# Patient Record
Sex: Female | Born: 1935 | Race: White | Hispanic: No
Health system: Southern US, Community
[De-identification: ages and names within clinical notes are randomized; demographics above are authoritative.]

## PROBLEM LIST (undated history)

## (undated) DIAGNOSIS — M810 Age-related osteoporosis without current pathological fracture: Secondary | ICD-10-CM

## (undated) DIAGNOSIS — M6281 Muscle weakness (generalized): Secondary | ICD-10-CM

## (undated) DIAGNOSIS — E785 Hyperlipidemia, unspecified: Secondary | ICD-10-CM

## (undated) DIAGNOSIS — F32A Depression, unspecified: Secondary | ICD-10-CM

## (undated) DIAGNOSIS — N390 Urinary tract infection, site not specified: Secondary | ICD-10-CM

## (undated) DIAGNOSIS — I2510 Atherosclerotic heart disease of native coronary artery without angina pectoris: Secondary | ICD-10-CM

## (undated) DIAGNOSIS — G9340 Encephalopathy, unspecified: Secondary | ICD-10-CM

## (undated) DIAGNOSIS — I10 Essential (primary) hypertension: Secondary | ICD-10-CM

## (undated) DIAGNOSIS — J45909 Unspecified asthma, uncomplicated: Secondary | ICD-10-CM

## (undated) DIAGNOSIS — E871 Hypo-osmolality and hyponatremia: Secondary | ICD-10-CM

## (undated) DIAGNOSIS — F0390 Unspecified dementia without behavioral disturbance: Secondary | ICD-10-CM

## (undated) DIAGNOSIS — F329 Major depressive disorder, single episode, unspecified: Secondary | ICD-10-CM

---

## 2003-03-11 ENCOUNTER — Encounter: Admission: RE | Admit: 2003-03-11 | Discharge: 2003-03-11 | Payer: Self-pay | Admitting: Neurosurgery

## 2003-04-26 ENCOUNTER — Encounter: Admission: RE | Admit: 2003-04-26 | Discharge: 2003-04-26 | Payer: Self-pay | Admitting: Neurosurgery

## 2003-09-01 ENCOUNTER — Encounter: Admission: RE | Admit: 2003-09-01 | Discharge: 2003-09-01 | Payer: Self-pay | Admitting: Neurosurgery

## 2003-09-02 ENCOUNTER — Ambulatory Visit (HOSPITAL_COMMUNITY): Admission: RE | Admit: 2003-09-02 | Discharge: 2003-09-02 | Payer: Self-pay | Admitting: Neurosurgery

## 2003-09-02 ENCOUNTER — Ambulatory Visit (HOSPITAL_BASED_OUTPATIENT_CLINIC_OR_DEPARTMENT_OTHER): Admission: RE | Admit: 2003-09-02 | Discharge: 2003-09-02 | Payer: Self-pay | Admitting: Neurosurgery

## 2003-11-11 ENCOUNTER — Ambulatory Visit: Payer: Self-pay | Admitting: Unknown Physician Specialty

## 2003-12-14 ENCOUNTER — Ambulatory Visit (HOSPITAL_COMMUNITY): Admission: RE | Admit: 2003-12-14 | Discharge: 2003-12-14 | Payer: Self-pay | Admitting: Anesthesiology

## 2004-09-22 ENCOUNTER — Ambulatory Visit: Payer: Self-pay | Admitting: Gerontology

## 2005-02-14 ENCOUNTER — Ambulatory Visit: Payer: Self-pay | Admitting: Anesthesiology

## 2005-02-27 ENCOUNTER — Ambulatory Visit: Payer: Self-pay | Admitting: Anesthesiology

## 2005-04-04 ENCOUNTER — Ambulatory Visit: Payer: Self-pay | Admitting: Anesthesiology

## 2005-04-20 ENCOUNTER — Ambulatory Visit: Payer: Self-pay | Admitting: Anesthesiology

## 2005-05-25 ENCOUNTER — Ambulatory Visit: Payer: Self-pay | Admitting: Internal Medicine

## 2005-05-31 ENCOUNTER — Ambulatory Visit: Payer: Self-pay | Admitting: Unknown Physician Specialty

## 2005-06-06 ENCOUNTER — Ambulatory Visit: Payer: Self-pay | Admitting: Unknown Physician Specialty

## 2005-06-28 ENCOUNTER — Ambulatory Visit: Payer: Self-pay | Admitting: Internal Medicine

## 2005-07-06 ENCOUNTER — Ambulatory Visit: Payer: Self-pay | Admitting: Internal Medicine

## 2005-11-09 ENCOUNTER — Ambulatory Visit: Payer: Self-pay | Admitting: Internal Medicine

## 2005-11-21 ENCOUNTER — Ambulatory Visit: Payer: Self-pay | Admitting: Internal Medicine

## 2005-11-21 ENCOUNTER — Encounter: Payer: Self-pay | Admitting: Internal Medicine

## 2006-01-05 ENCOUNTER — Encounter: Payer: Self-pay | Admitting: Internal Medicine

## 2006-01-21 ENCOUNTER — Ambulatory Visit: Payer: Self-pay | Admitting: Anesthesiology

## 2006-02-07 ENCOUNTER — Encounter: Payer: Self-pay | Admitting: Anesthesiology

## 2006-02-20 ENCOUNTER — Ambulatory Visit: Payer: Self-pay | Admitting: Anesthesiology

## 2006-02-26 ENCOUNTER — Ambulatory Visit: Payer: Self-pay | Admitting: Ophthalmology

## 2006-03-05 ENCOUNTER — Ambulatory Visit: Payer: Self-pay | Admitting: Ophthalmology

## 2006-03-08 ENCOUNTER — Encounter: Payer: Self-pay | Admitting: Anesthesiology

## 2006-03-19 ENCOUNTER — Ambulatory Visit: Payer: Self-pay | Admitting: Anesthesiology

## 2006-04-18 ENCOUNTER — Ambulatory Visit: Payer: Self-pay | Admitting: Anesthesiology

## 2006-04-24 ENCOUNTER — Ambulatory Visit: Payer: Self-pay | Admitting: Ophthalmology

## 2006-04-30 ENCOUNTER — Ambulatory Visit: Payer: Self-pay | Admitting: Ophthalmology

## 2006-05-20 ENCOUNTER — Ambulatory Visit: Payer: Self-pay | Admitting: Anesthesiology

## 2006-06-17 ENCOUNTER — Ambulatory Visit: Payer: Self-pay | Admitting: Anesthesiology

## 2006-08-12 ENCOUNTER — Ambulatory Visit: Payer: Self-pay | Admitting: Anesthesiology

## 2006-09-25 ENCOUNTER — Ambulatory Visit: Payer: Self-pay | Admitting: Anesthesiology

## 2007-03-12 ENCOUNTER — Ambulatory Visit: Payer: Self-pay | Admitting: Anesthesiology

## 2007-09-01 ENCOUNTER — Ambulatory Visit: Payer: Self-pay | Admitting: Urology

## 2007-10-13 ENCOUNTER — Emergency Department: Payer: Self-pay | Admitting: Internal Medicine

## 2007-10-14 ENCOUNTER — Emergency Department: Payer: Self-pay | Admitting: Emergency Medicine

## 2007-10-24 ENCOUNTER — Emergency Department: Payer: Self-pay | Admitting: Emergency Medicine

## 2008-08-16 ENCOUNTER — Ambulatory Visit: Payer: Self-pay | Admitting: Internal Medicine

## 2008-09-06 ENCOUNTER — Ambulatory Visit: Payer: Self-pay | Admitting: Anesthesiology

## 2008-10-07 ENCOUNTER — Ambulatory Visit: Payer: Self-pay | Admitting: Anesthesiology

## 2008-11-03 ENCOUNTER — Emergency Department: Payer: Self-pay | Admitting: Emergency Medicine

## 2008-12-01 ENCOUNTER — Ambulatory Visit: Payer: Self-pay | Admitting: Internal Medicine

## 2008-12-11 ENCOUNTER — Inpatient Hospital Stay: Payer: Self-pay | Admitting: Internal Medicine

## 2008-12-26 ENCOUNTER — Inpatient Hospital Stay: Payer: Self-pay | Admitting: Internal Medicine

## 2009-03-16 ENCOUNTER — Ambulatory Visit: Payer: Self-pay | Admitting: Anesthesiology

## 2009-05-18 ENCOUNTER — Ambulatory Visit: Payer: Self-pay | Admitting: Anesthesiology

## 2009-05-24 ENCOUNTER — Ambulatory Visit: Payer: Self-pay | Admitting: Internal Medicine

## 2009-07-09 ENCOUNTER — Ambulatory Visit: Payer: Self-pay

## 2009-08-15 ENCOUNTER — Inpatient Hospital Stay (HOSPITAL_COMMUNITY): Admission: RE | Admit: 2009-08-15 | Discharge: 2009-08-19 | Payer: Self-pay | Admitting: Neurosurgery

## 2009-11-25 ENCOUNTER — Observation Stay: Payer: Self-pay | Admitting: Internal Medicine

## 2010-01-30 ENCOUNTER — Emergency Department: Payer: Self-pay | Admitting: Emergency Medicine

## 2010-02-01 ENCOUNTER — Inpatient Hospital Stay: Payer: Self-pay | Admitting: Specialist

## 2010-04-23 LAB — DIFFERENTIAL
Basophils Absolute: 0 10*3/uL (ref 0.0–0.1)
Basophils Relative: 0 % (ref 0–1)
Eosinophils Absolute: 0.1 10*3/uL (ref 0.0–0.7)
Eosinophils Relative: 1 % (ref 0–5)
Lymphocytes Relative: 21 % (ref 12–46)
Lymphs Abs: 1.7 10*3/uL (ref 0.7–4.0)
Monocytes Absolute: 0.6 10*3/uL (ref 0.1–1.0)
Monocytes Relative: 8 % (ref 3–12)
Neutro Abs: 5.4 10*3/uL (ref 1.7–7.7)
Neutrophils Relative %: 69 % (ref 43–77)

## 2010-04-23 LAB — CBC
HCT: 39.3 % (ref 36.0–46.0)
Hemoglobin: 13.6 g/dL (ref 12.0–15.0)
MCH: 31.4 pg (ref 26.0–34.0)
MCHC: 34.5 g/dL (ref 30.0–36.0)
MCV: 91 fL (ref 78.0–100.0)
Platelets: 256 10*3/uL (ref 150–400)
RBC: 4.32 MIL/uL (ref 3.87–5.11)
RDW: 13.6 % (ref 11.5–15.5)
WBC: 7.8 10*3/uL (ref 4.0–10.5)

## 2010-04-23 LAB — BASIC METABOLIC PANEL
BUN: 16 mg/dL (ref 6–23)
CO2: 30 mEq/L (ref 19–32)
Calcium: 9.9 mg/dL (ref 8.4–10.5)
Chloride: 97 mEq/L (ref 96–112)
Creatinine, Ser: 0.96 mg/dL (ref 0.4–1.2)
GFR calc Af Amer: >60 mL/min (ref >60)
GFR calc non Af Amer: 55 mL/min — ABNORMAL LOW (ref >60)
Glucose, Bld: 114 mg/dL — ABNORMAL HIGH (ref 70–99)
Potassium: 4.1 mEq/L (ref 3.5–5.1)
Sodium: 134 mEq/L — ABNORMAL LOW (ref 135–145)

## 2010-04-23 LAB — TYPE AND SCREEN
ABO/RH(D): O NEG
Antibody Screen: NEGATIVE

## 2010-04-23 LAB — SURGICAL PCR SCREEN
MRSA, PCR: NEGATIVE
Staphylococcus aureus: NEGATIVE

## 2010-04-23 LAB — ABO/RH: ABO/RH(D): O NEG

## 2010-06-23 NOTE — Op Note (Signed)
NAME:  [name], [name]                           ACCOUNT NO.:  [id number]   MEDICAL RECORD NO.:  [id number]                   PATIENT TYPE:  AMB   LOCATION:  DSC                                  FACILITY:  [facility]   PHYSICIAN:  Henry A. Pool, M.D.                 DATE OF BIRTH:  January 29, 1923   DATE OF PROCEDURE:  09/02/2003  DATE OF DISCHARGE:                                 OPERATIVE REPORT   PREOPERATIVE DIAGNOSIS:  Right carpal tunnel syndrome.   POSTOPERATIVE DIAGNOSIS:  Right carpal tunnel syndrome.   PROCEDURE:  Right carpal tunnel release.   SURGEON:  Kathaleen Maser. Pool, M.D.   ANESTHESIA:  Regional Bier block.   INDICATIONS:  [name] is a  77 year old female with history of right hand  and forearm discomfort with associated paresthesias and numbness consistent  with right-sided carpal tunnel syndrome.  This has been confirmed by EMGs  and nerve conduction studies.  The patient has failed conservative  management.  She reports now for carpal tunnel release.   OPERATIVE NOTE:  Patient taken to the operating, placed on the table in a  supine position.  After an adequate level of regional anesthesia achieved,  the patient's right hand and forearm were prepped and draped serially.  A 15  blade was used to make a linear skin incision along the mid palmar line just  distal to the distal wrist crease.  This was carried down sharply to the  palmar fascia.  A self-retaining retractor was placed.  The transverse  carpal ligament was then identified and divided using a 15 blade down to the  level of the median nerve.  The median nerve was identified.  A Glorious Peach was  then placed between the median nerve and the transverse carpal ligament.  The transverse carpal ligament was then resected distally first until fat  was visualized.  There was no evidence of any residual compression.  The  ligament was then resected proximally into the proximal forearm.  There was  no evidence of any further  compression of the nerve.  The Glorious Peach passed  easily both proximally and distally.  The nerve showed no evidence of  injury.  The wound was then irrigated with antibiotic solution.  It was then  closed in typical fashion with 4-0 Vicryl suture in an inverted deep dermis  layer and 5-0 nylon in an interrupted vertical mattress fashion.  A sterile  dressing was applied.  The patient tolerated the procedure well and  transferred to the recovery room postoperatively.                                               Henry A. Pool, M.D.    HAP/MEDQ  D:  09/02/2003  T:  09/02/2003  Job:  [id number] [id number]

## 2010-08-22 ENCOUNTER — Ambulatory Visit: Payer: Self-pay | Admitting: Internal Medicine

## 2011-02-10 ENCOUNTER — Inpatient Hospital Stay: Payer: Self-pay | Admitting: Specialist

## 2011-02-10 LAB — COMPREHENSIVE METABOLIC PANEL
Alkaline Phosphatase: 80 U/L (ref 50–136)
BUN: 22 mg/dL — ABNORMAL HIGH (ref 7–18)
Chloride: 98 mmol/L (ref 98–107)
Co2: 23 mmol/L (ref 21–32)
Creatinine: 1 mg/dL (ref 0.60–1.30)
EGFR (African American): >60
EGFR (Non-African Amer.): 56 — ABNORMAL LOW
Glucose: 140 mg/dL — ABNORMAL HIGH (ref 65–99)
SGOT(AST): 19 U/L (ref 15–37)
SGPT (ALT): 19 U/L
Total Protein: 7.6 g/dL (ref 6.4–8.2)

## 2011-02-10 LAB — URINALYSIS, COMPLETE
Bilirubin,UR: NEGATIVE
Ketone: NEGATIVE
Nitrite: NEGATIVE
Ph: 5 (ref 4.5–8.0)
Squamous Epithelial: 2
WBC UR: 42 /HPF (ref 0–5)

## 2011-02-10 LAB — CBC
MCH: 30.8 pg (ref 26.0–34.0)
MCV: 92 fL (ref 80–100)
Platelet: 222 10*3/uL (ref 150–440)
RBC: 4 10*6/uL (ref 3.80–5.20)

## 2011-02-10 LAB — PROTIME-INR: INR: 1.1

## 2011-02-10 LAB — APTT: Activated PTT: 36.5 secs — ABNORMAL HIGH (ref 23.6–35.9)

## 2011-02-10 LAB — CK TOTAL AND CKMB (NOT AT ARMC): CK, Total: 94 U/L (ref 21–215)

## 2011-02-10 LAB — PRO B NATRIURETIC PEPTIDE: B-Type Natriuretic Peptide: 981 pg/mL — ABNORMAL HIGH (ref 0–450)

## 2011-02-11 LAB — CBC WITH DIFFERENTIAL/PLATELET
Basophil %: 0 %
Eosinophil #: 0 10*3/uL (ref 0.0–0.7)
Lymphocyte #: 0.5 10*3/uL — ABNORMAL LOW (ref 1.0–3.6)
MCH: 31.4 pg (ref 26.0–34.0)
MCHC: 34.4 g/dL (ref 32.0–36.0)
MCV: 91 fL (ref 80–100)
Monocyte #: 0.4 10*3/uL (ref 0.0–0.7)
Neutrophil #: 16.7 10*3/uL — ABNORMAL HIGH (ref 1.4–6.5)
Neutrophil %: 94.9 %

## 2011-02-11 LAB — LIPID PANEL
Cholesterol: 93 mg/dL (ref 0–200)
HDL Cholesterol: 45 mg/dL (ref 40–60)
Triglycerides: 68 mg/dL (ref 0–200)
VLDL Cholesterol, Calc: 14 mg/dL (ref 5–40)

## 2011-02-11 LAB — HEMOGLOBIN A1C: Hemoglobin A1C: 6.2 % (ref 4.2–6.3)

## 2011-02-11 LAB — BASIC METABOLIC PANEL
Anion Gap: 14 (ref 7–16)
BUN: 24 mg/dL — ABNORMAL HIGH (ref 7–18)
Calcium, Total: 8.7 mg/dL (ref 8.5–10.1)
Co2: 22 mmol/L (ref 21–32)
EGFR (African American): >60
EGFR (Non-African Amer.): 53 — ABNORMAL LOW
Glucose: 117 mg/dL — ABNORMAL HIGH (ref 65–99)
Osmolality: 277 (ref 275–301)
Potassium: 4 mmol/L (ref 3.5–5.1)
Sodium: 136 mmol/L (ref 136–145)

## 2011-02-11 LAB — CK TOTAL AND CKMB (NOT AT ARMC)
CK, Total: 98 U/L (ref 21–215)
CK-MB: 1.4 ng/mL (ref 0.5–3.6)

## 2011-02-11 LAB — TROPONIN I: Troponin-I: <0.02 ng/mL

## 2011-02-12 LAB — CBC WITH DIFFERENTIAL/PLATELET
Basophil %: 0.2 %
Eosinophil #: 0.1 10*3/uL (ref 0.0–0.7)
HCT: 31.5 % — ABNORMAL LOW (ref 35.0–47.0)
HGB: 10.7 g/dL — ABNORMAL LOW (ref 12.0–16.0)
Lymphocyte #: 0.5 10*3/uL — ABNORMAL LOW (ref 1.0–3.6)
MCH: 31.4 pg (ref 26.0–34.0)
MCHC: 33.9 g/dL (ref 32.0–36.0)
MCV: 93 fL (ref 80–100)
Monocyte #: 0.8 10*3/uL — ABNORMAL HIGH (ref 0.0–0.7)
Neutrophil #: 11.6 10*3/uL — ABNORMAL HIGH (ref 1.4–6.5)
Neutrophil %: 89.2 %
WBC: 13 10*3/uL — ABNORMAL HIGH (ref 3.6–11.0)

## 2011-02-13 LAB — CBC WITH DIFFERENTIAL/PLATELET
Basophil #: 0 10*3/uL (ref 0.0–0.1)
Basophil %: 0.2 %
Eosinophil #: 0.1 10*3/uL (ref 0.0–0.7)
Eosinophil %: 1.4 %
HGB: 10.4 g/dL — ABNORMAL LOW (ref 12.0–16.0)
Lymphocyte #: 0.5 10*3/uL — ABNORMAL LOW (ref 1.0–3.6)
Lymphocyte %: 4.8 %
MCH: 30.8 pg (ref 26.0–34.0)
MCV: 93 fL (ref 80–100)
Monocyte #: 0.9 10*3/uL — ABNORMAL HIGH (ref 0.0–0.7)
Monocyte %: 8.3 %
Neutrophil %: 85.3 %
RBC: 3.36 10*6/uL — ABNORMAL LOW (ref 3.80–5.20)
WBC: 10.3 10*3/uL (ref 3.6–11.0)

## 2011-02-14 LAB — CBC WITH DIFFERENTIAL/PLATELET
Basophil %: 0.1 %
Eosinophil %: 0.5 %
HCT: 33.1 % — ABNORMAL LOW (ref 35.0–47.0)
HGB: 11.1 g/dL — ABNORMAL LOW (ref 12.0–16.0)
Lymphocyte %: 5.8 %
MCV: 93 fL (ref 80–100)
Monocyte #: 0.9 10*3/uL — ABNORMAL HIGH (ref 0.0–0.7)
Monocyte %: 8.9 %
Neutrophil #: 8.1 10*3/uL — ABNORMAL HIGH (ref 1.4–6.5)
Neutrophil %: 84.7 %
Platelet: 265 10*3/uL (ref 150–440)
WBC: 9.6 10*3/uL (ref 3.6–11.0)

## 2011-02-14 LAB — BASIC METABOLIC PANEL
Anion Gap: 10 (ref 7–16)
BUN: 17 mg/dL (ref 7–18)
Chloride: 99 mmol/L (ref 98–107)
Co2: 27 mmol/L (ref 21–32)
Creatinine: 0.89 mg/dL (ref 0.60–1.30)
Glucose: 134 mg/dL — ABNORMAL HIGH (ref 65–99)
Osmolality: 275 (ref 275–301)
Potassium: 3.8 mmol/L (ref 3.5–5.1)
Sodium: 136 mmol/L (ref 136–145)

## 2011-05-21 ENCOUNTER — Emergency Department: Payer: Self-pay | Admitting: Emergency Medicine

## 2011-05-21 LAB — CBC
HGB: 13.2 g/dL (ref 12.0–16.0)
MCHC: 33.3 g/dL (ref 32.0–36.0)
Platelet: 245 10*3/uL (ref 150–440)
RBC: 4.43 10*6/uL (ref 3.80–5.20)
RDW: 13.8 % (ref 11.5–14.5)
WBC: 6.4 10*3/uL (ref 3.6–11.0)

## 2011-05-21 LAB — COMPREHENSIVE METABOLIC PANEL
Anion Gap: 10 (ref 7–16)
BUN: 17 mg/dL (ref 7–18)
Bilirubin,Total: 0.4 mg/dL (ref 0.2–1.0)
Chloride: 101 mmol/L (ref 98–107)
EGFR (African American): >60
EGFR (Non-African Amer.): 55 — ABNORMAL LOW
Potassium: 4 mmol/L (ref 3.5–5.1)
SGOT(AST): 21 U/L (ref 15–37)
SGPT (ALT): 20 U/L
Sodium: 136 mmol/L (ref 136–145)
Total Protein: 7.1 g/dL (ref 6.4–8.2)

## 2011-05-21 LAB — URINALYSIS, COMPLETE
Nitrite: NEGATIVE
Protein: NEGATIVE
RBC,UR: 6 /HPF (ref 0–5)
Squamous Epithelial: 2
WBC UR: 131 /HPF (ref 0–5)

## 2011-05-21 LAB — LIPASE, BLOOD: Lipase: 181 U/L (ref 73–393)

## 2011-12-20 ENCOUNTER — Inpatient Hospital Stay: Payer: Self-pay | Admitting: Internal Medicine

## 2011-12-20 LAB — COMPREHENSIVE METABOLIC PANEL
Albumin: 3.5 g/dL (ref 3.4–5.0)
Anion Gap: 5 — ABNORMAL LOW (ref 7–16)
BUN: 25 mg/dL — ABNORMAL HIGH (ref 7–18)
Chloride: 102 mmol/L (ref 98–107)
Creatinine: 0.84 mg/dL (ref 0.60–1.30)
EGFR (Non-African Amer.): >60
Glucose: 80 mg/dL (ref 65–99)
Osmolality: 270 (ref 275–301)
Potassium: 4.1 mmol/L (ref 3.5–5.1)
SGOT(AST): 21 U/L (ref 15–37)
Sodium: 133 mmol/L — ABNORMAL LOW (ref 136–145)
Total Protein: 6.9 g/dL (ref 6.4–8.2)

## 2011-12-20 LAB — URINALYSIS, COMPLETE
Glucose,UR: NEGATIVE mg/dL (ref 0–75)
Leukocyte Esterase: NEGATIVE
Nitrite: NEGATIVE
Protein: NEGATIVE
RBC,UR: 1 /HPF (ref 0–5)
Squamous Epithelial: NONE SEEN
WBC UR: <1 /HPF (ref 0–5)

## 2011-12-20 LAB — CBC
HCT: 36.8 % (ref 35.0–47.0)
MCH: 30.9 pg (ref 26.0–34.0)
MCHC: 34.7 g/dL (ref 32.0–36.0)
MCV: 89 fL (ref 80–100)
Platelet: 249 10*3/uL (ref 150–440)
RBC: 4.13 10*6/uL (ref 3.80–5.20)
RDW: 13.1 % (ref 11.5–14.5)

## 2011-12-20 LAB — TROPONIN I: Troponin-I: <0.02 ng/mL

## 2011-12-21 LAB — COMPREHENSIVE METABOLIC PANEL
Anion Gap: 6 — ABNORMAL LOW (ref 7–16)
BUN: 17 mg/dL (ref 7–18)
Calcium, Total: 9 mg/dL (ref 8.5–10.1)
Chloride: 101 mmol/L (ref 98–107)
EGFR (African American): >60
Potassium: 3.8 mmol/L (ref 3.5–5.1)
SGOT(AST): 21 U/L (ref 15–37)
Total Protein: 6.5 g/dL (ref 6.4–8.2)

## 2011-12-21 LAB — CBC WITH DIFFERENTIAL/PLATELET
Basophil #: 0 10*3/uL (ref 0.0–0.1)
Eosinophil #: 0.1 10*3/uL (ref 0.0–0.7)
HCT: 36.1 % (ref 35.0–47.0)
HGB: 12.7 g/dL (ref 12.0–16.0)
Lymphocyte #: 1.4 10*3/uL (ref 1.0–3.6)
Lymphocyte %: 29.2 %
MCH: 31.5 pg (ref 26.0–34.0)
MCHC: 35.1 g/dL (ref 32.0–36.0)
Monocyte %: 12.4 %
Platelet: 222 10*3/uL (ref 150–440)
RDW: 12.7 % (ref 11.5–14.5)

## 2011-12-21 LAB — URINE CULTURE

## 2011-12-26 LAB — CULTURE, BLOOD (SINGLE)

## 2012-02-22 ENCOUNTER — Emergency Department: Payer: Self-pay | Admitting: Emergency Medicine

## 2012-02-22 LAB — COMPREHENSIVE METABOLIC PANEL
Alkaline Phosphatase: 127 U/L (ref 50–136)
Anion Gap: 10 (ref 7–16)
BUN: 23 mg/dL — ABNORMAL HIGH (ref 7–18)
Bilirubin,Total: 0.3 mg/dL (ref 0.2–1.0)
Calcium, Total: 9.1 mg/dL (ref 8.5–10.1)
Creatinine: 0.8 mg/dL (ref 0.60–1.30)
EGFR (African American): >60
EGFR (Non-African Amer.): >60
Glucose: 160 mg/dL — ABNORMAL HIGH (ref 65–99)
Osmolality: 283 (ref 275–301)
Potassium: 3.6 mmol/L (ref 3.5–5.1)
Sodium: 138 mmol/L (ref 136–145)
Total Protein: 6.8 g/dL (ref 6.4–8.2)

## 2012-02-22 LAB — URINALYSIS, COMPLETE
Bacteria: NONE SEEN
Blood: NEGATIVE
Glucose,UR: NEGATIVE mg/dL (ref 0–75)
Ketone: NEGATIVE
Leukocyte Esterase: NEGATIVE
Nitrite: NEGATIVE
RBC,UR: <1 /HPF (ref 0–5)
Specific Gravity: 1.017 (ref 1.003–1.030)
Squamous Epithelial: NONE SEEN

## 2012-02-22 LAB — CBC
HCT: 35.9 % (ref 35.0–47.0)
MCH: 31.5 pg (ref 26.0–34.0)
MCHC: 34.1 g/dL (ref 32.0–36.0)
MCV: 92 fL (ref 80–100)
RBC: 3.9 10*6/uL (ref 3.80–5.20)
WBC: 5.4 10*3/uL (ref 3.6–11.0)

## 2012-02-22 LAB — LIPASE, BLOOD: Lipase: 124 U/L (ref 73–393)

## 2012-05-06 ENCOUNTER — Emergency Department: Payer: Self-pay | Admitting: Emergency Medicine

## 2012-05-06 LAB — COMPREHENSIVE METABOLIC PANEL
Calcium, Total: 8.6 mg/dL (ref 8.5–10.1)
Co2: 29 mmol/L (ref 21–32)
Creatinine: 0.68 mg/dL (ref 0.60–1.30)
EGFR (African American): >60
EGFR (Non-African Amer.): >60
Osmolality: 265 (ref 275–301)
Potassium: 4 mmol/L (ref 3.5–5.1)
SGOT(AST): 19 U/L (ref 15–37)

## 2012-05-06 LAB — CBC
HCT: 34.2 % — ABNORMAL LOW (ref 35.0–47.0)
HGB: 11.6 g/dL — ABNORMAL LOW (ref 12.0–16.0)
MCV: 92 fL (ref 80–100)
RDW: 13.5 % (ref 11.5–14.5)

## 2012-05-06 LAB — TROPONIN I: Troponin-I: <0.02 ng/mL

## 2012-06-06 ENCOUNTER — Inpatient Hospital Stay: Payer: Self-pay | Admitting: Internal Medicine

## 2012-06-06 LAB — CBC
HCT: 40.1 % (ref 35.0–47.0)
HGB: 13.7 g/dL (ref 12.0–16.0)
MCH: 30.3 pg (ref 26.0–34.0)
MCHC: 34.2 g/dL (ref 32.0–36.0)
MCV: 89 fL (ref 80–100)
RDW: 14 % (ref 11.5–14.5)

## 2012-06-06 LAB — URINALYSIS, COMPLETE
Bilirubin,UR: NEGATIVE
Blood: NEGATIVE
Glucose,UR: NEGATIVE mg/dL (ref 0–75)
Ph: 6 (ref 4.5–8.0)
Protein: NEGATIVE
RBC,UR: 1 /HPF (ref 0–5)
Squamous Epithelial: NONE SEEN

## 2012-06-06 LAB — COMPREHENSIVE METABOLIC PANEL
Albumin: 3.3 g/dL — ABNORMAL LOW (ref 3.4–5.0)
Alkaline Phosphatase: 77 U/L (ref 50–136)
Anion Gap: 5 — ABNORMAL LOW (ref 7–16)
BUN: 22 mg/dL — ABNORMAL HIGH (ref 7–18)
Calcium, Total: 9.1 mg/dL (ref 8.5–10.1)
Chloride: 104 mmol/L (ref 98–107)
Co2: 25 mmol/L (ref 21–32)
Potassium: 3.3 mmol/L — ABNORMAL LOW (ref 3.5–5.1)
SGOT(AST): 20 U/L (ref 15–37)
Total Protein: 7.1 g/dL (ref 6.4–8.2)

## 2012-06-06 LAB — LIPASE, BLOOD: Lipase: 73 U/L (ref 73–393)

## 2012-06-07 LAB — CBC WITH DIFFERENTIAL/PLATELET
Basophil #: 0 10*3/uL (ref 0.0–0.1)
Eosinophil #: 0 10*3/uL (ref 0.0–0.7)
Eosinophil %: 0.9 %
Lymphocyte %: 15.1 %
MCH: 31 pg (ref 26.0–34.0)
MCHC: 34.5 g/dL (ref 32.0–36.0)
Monocyte %: 8 %
Neutrophil %: 75.4 %
Platelet: 231 10*3/uL (ref 150–440)
RBC: 3.77 10*6/uL — ABNORMAL LOW (ref 3.80–5.20)
RDW: 14 % (ref 11.5–14.5)
WBC: 5 10*3/uL (ref 3.6–11.0)

## 2012-06-07 LAB — BASIC METABOLIC PANEL
Anion Gap: 7 (ref 7–16)
Calcium, Total: 8.3 mg/dL — ABNORMAL LOW (ref 8.5–10.1)
Chloride: 111 mmol/L — ABNORMAL HIGH (ref 98–107)
Co2: 22 mmol/L (ref 21–32)
EGFR (African American): >60
EGFR (Non-African Amer.): >60
Glucose: 74 mg/dL (ref 65–99)
Osmolality: 279 (ref 275–301)
Potassium: 3.9 mmol/L (ref 3.5–5.1)
Sodium: 140 mmol/L (ref 136–145)

## 2012-06-07 LAB — WBCS, STOOL

## 2012-06-10 LAB — STOOL CULTURE

## 2012-09-14 LAB — COMPREHENSIVE METABOLIC PANEL
Albumin: 3.2 g/dL — ABNORMAL LOW (ref 3.4–5.0)
Alkaline Phosphatase: 94 U/L (ref 50–136)
BUN: 19 mg/dL — ABNORMAL HIGH (ref 7–18)
Bilirubin,Total: 0.4 mg/dL (ref 0.2–1.0)
Calcium, Total: 9.1 mg/dL (ref 8.5–10.1)
Chloride: 100 mmol/L (ref 98–107)
EGFR (Non-African Amer.): 51 — ABNORMAL LOW
Glucose: 115 mg/dL — ABNORMAL HIGH (ref 65–99)
Potassium: 4.1 mmol/L (ref 3.5–5.1)
SGOT(AST): 17 U/L (ref 15–37)

## 2012-09-14 LAB — CBC
HGB: 12.3 g/dL (ref 12.0–16.0)
MCHC: 35 g/dL (ref 32.0–36.0)
Platelet: 205 10*3/uL (ref 150–440)

## 2012-09-15 ENCOUNTER — Inpatient Hospital Stay: Payer: Self-pay | Admitting: Internal Medicine

## 2012-09-16 LAB — CBC WITH DIFFERENTIAL/PLATELET
Basophil #: 0 10*3/uL (ref 0.0–0.1)
Basophil %: 0.5 %
Eosinophil #: 0.1 10*3/uL (ref 0.0–0.7)
Eosinophil %: 1.4 %
HCT: 34.1 % — ABNORMAL LOW (ref 35.0–47.0)
HGB: 12 g/dL (ref 12.0–16.0)
Lymphocyte %: 7.3 %
MCH: 30.9 pg (ref 26.0–34.0)
MCHC: 35.3 g/dL (ref 32.0–36.0)
Monocyte %: 9.5 %
Neutrophil %: 81.3 %
RBC: 3.9 10*6/uL (ref 3.80–5.20)
RDW: 14.3 % (ref 11.5–14.5)
WBC: 7.1 10*3/uL (ref 3.6–11.0)

## 2012-09-16 LAB — BASIC METABOLIC PANEL
BUN: 16 mg/dL (ref 7–18)
Calcium, Total: 9.1 mg/dL (ref 8.5–10.1)
Chloride: 102 mmol/L (ref 98–107)
Co2: 26 mmol/L (ref 21–32)
EGFR (African American): >60
EGFR (Non-African Amer.): >60
Potassium: 4.3 mmol/L (ref 3.5–5.1)
Sodium: 133 mmol/L — ABNORMAL LOW (ref 136–145)

## 2012-09-16 LAB — LIPID PANEL
Ldl Cholesterol, Calc: 87 mg/dL (ref 0–100)
Triglycerides: 148 mg/dL (ref 0–200)
VLDL Cholesterol, Calc: 30 mg/dL (ref 5–40)

## 2012-09-17 LAB — URINALYSIS, COMPLETE
Glucose,UR: NEGATIVE mg/dL (ref 0–75)
Ketone: NEGATIVE
Nitrite: NEGATIVE
RBC,UR: 8 /HPF (ref 0–5)
Specific Gravity: 1.01 (ref 1.003–1.030)
Squamous Epithelial: NONE SEEN
WBC UR: 106 /HPF (ref 0–5)

## 2012-09-17 LAB — HEMOGLOBIN: HGB: 12.2 g/dL (ref 12.0–16.0)

## 2012-09-18 LAB — SODIUM: Sodium: 135 mmol/L — ABNORMAL LOW (ref 136–145)

## 2013-01-23 ENCOUNTER — Inpatient Hospital Stay: Payer: Self-pay | Admitting: Internal Medicine

## 2013-01-23 LAB — COMPREHENSIVE METABOLIC PANEL
Albumin: 3.8 g/dL (ref 3.4–5.0)
Alkaline Phosphatase: 90 U/L
Anion Gap: 6 — ABNORMAL LOW (ref 7–16)
BUN: 17 mg/dL (ref 7–18)
Bilirubin,Total: 0.4 mg/dL (ref 0.2–1.0)
Calcium, Total: 9.6 mg/dL (ref 8.5–10.1)
Chloride: 94 mmol/L — ABNORMAL LOW (ref 98–107)
Co2: 28 mmol/L (ref 21–32)
EGFR (Non-African Amer.): >60
Glucose: 108 mg/dL — ABNORMAL HIGH (ref 65–99)
Potassium: 4.7 mmol/L (ref 3.5–5.1)
SGOT(AST): 34 U/L (ref 15–37)
Sodium: 128 mmol/L — ABNORMAL LOW (ref 136–145)
Total Protein: 6.9 g/dL (ref 6.4–8.2)

## 2013-01-23 LAB — CBC
HCT: 39.9 % (ref 35.0–47.0)
HGB: 13.2 g/dL (ref 12.0–16.0)
MCH: 29.7 pg (ref 26.0–34.0)
MCHC: 33.2 g/dL (ref 32.0–36.0)
RDW: 12.7 % (ref 11.5–14.5)
WBC: 5.8 10*3/uL (ref 3.6–11.0)

## 2013-01-23 LAB — MAGNESIUM: Magnesium: 1.5 mg/dL — ABNORMAL LOW

## 2013-01-23 LAB — URINALYSIS, COMPLETE
Glucose,UR: NEGATIVE mg/dL (ref 0–75)
Nitrite: NEGATIVE
Ph: 6 (ref 4.5–8.0)
Protein: 30
Specific Gravity: 1.018 (ref 1.003–1.030)

## 2013-01-23 LAB — TROPONIN I: Troponin-I: <0.02 ng/mL

## 2013-01-24 LAB — CBC WITH DIFFERENTIAL/PLATELET
Eosinophil %: 0.3 %
HGB: 12.2 g/dL (ref 12.0–16.0)
Lymphocyte %: 18.9 %
MCH: 30.2 pg (ref 26.0–34.0)
MCHC: 33.9 g/dL (ref 32.0–36.0)
MCV: 89 fL (ref 80–100)
Monocyte #: 0.5 x10 3/mm (ref 0.2–0.9)
Monocyte %: 10.4 %
Neutrophil #: 3.2 10*3/uL (ref 1.4–6.5)
Neutrophil %: 70.1 %
RBC: 4.04 10*6/uL (ref 3.80–5.20)
RDW: 12.8 % (ref 11.5–14.5)
WBC: 4.6 10*3/uL (ref 3.6–11.0)

## 2013-01-24 LAB — BASIC METABOLIC PANEL
Anion Gap: 7 (ref 7–16)
BUN: 11 mg/dL (ref 7–18)
Calcium, Total: 8.8 mg/dL (ref 8.5–10.1)
Chloride: 100 mmol/L (ref 98–107)
Co2: 23 mmol/L (ref 21–32)
EGFR (Non-African Amer.): >60
Osmolality: 260 (ref 275–301)
Potassium: 3.7 mmol/L (ref 3.5–5.1)
Sodium: 130 mmol/L — ABNORMAL LOW (ref 136–145)

## 2013-01-24 LAB — MAGNESIUM: Magnesium: 1.8 mg/dL

## 2013-01-25 LAB — BASIC METABOLIC PANEL
Anion Gap: 5 — ABNORMAL LOW (ref 7–16)
BUN: 9 mg/dL (ref 7–18)
Calcium, Total: 9.3 mg/dL (ref 8.5–10.1)
Chloride: 99 mmol/L (ref 98–107)
Co2: 27 mmol/L (ref 21–32)
Glucose: 108 mg/dL — ABNORMAL HIGH (ref 65–99)
Osmolality: 262 (ref 275–301)
Potassium: 3.8 mmol/L (ref 3.5–5.1)
Sodium: 131 mmol/L — ABNORMAL LOW (ref 136–145)

## 2013-01-27 LAB — BASIC METABOLIC PANEL
Anion Gap: 7 (ref 7–16)
BUN: 9 mg/dL (ref 7–18)
Calcium, Total: 9.1 mg/dL (ref 8.5–10.1)
Chloride: 98 mmol/L (ref 98–107)
Co2: 25 mmol/L (ref 21–32)
Creatinine: 0.59 mg/dL — ABNORMAL LOW (ref 0.60–1.30)
EGFR (African American): >60
Osmolality: 259 (ref 275–301)
Potassium: 3.4 mmol/L — ABNORMAL LOW (ref 3.5–5.1)
Sodium: 130 mmol/L — ABNORMAL LOW (ref 136–145)

## 2013-01-28 LAB — URINE CULTURE

## 2013-02-08 ENCOUNTER — Inpatient Hospital Stay: Payer: Self-pay | Admitting: Student

## 2013-02-08 ENCOUNTER — Ambulatory Visit: Payer: Self-pay | Admitting: Orthopedic Surgery

## 2013-02-08 LAB — URINALYSIS, COMPLETE
Bacteria: NONE SEEN
Bilirubin,UR: NEGATIVE
Blood: NEGATIVE
GLUCOSE, UR: NEGATIVE mg/dL (ref 0–75)
KETONE: NEGATIVE
Leukocyte Esterase: NEGATIVE
Nitrite: NEGATIVE
Ph: 8 (ref 4.5–8.0)
Protein: NEGATIVE
RBC,UR: 1 /HPF (ref 0–5)
SPECIFIC GRAVITY: 1.01 (ref 1.003–1.030)
Squamous Epithelial: NONE SEEN

## 2013-02-08 LAB — BASIC METABOLIC PANEL
Anion Gap: 4 — ABNORMAL LOW (ref 7–16)
BUN: 15 mg/dL (ref 7–18)
CHLORIDE: 96 mmol/L — AB (ref 98–107)
CO2: 31 mmol/L (ref 21–32)
CREATININE: 0.66 mg/dL (ref 0.60–1.30)
Calcium, Total: 10 mg/dL (ref 8.5–10.1)
EGFR (African American): >60
EGFR (Non-African Amer.): >60
Glucose: 112 mg/dL — ABNORMAL HIGH (ref 65–99)
Osmolality: 264 (ref 275–301)
POTASSIUM: 3.8 mmol/L (ref 3.5–5.1)
Sodium: 131 mmol/L — ABNORMAL LOW (ref 136–145)

## 2013-02-08 LAB — CBC WITH DIFFERENTIAL/PLATELET
BASOS ABS: 0 10*3/uL (ref 0.0–0.1)
BASOS PCT: 0.7 %
Eosinophil #: 0.1 10*3/uL (ref 0.0–0.7)
Eosinophil %: 1.3 %
HCT: 38.2 % (ref 35.0–47.0)
HGB: 12.9 g/dL (ref 12.0–16.0)
LYMPHS ABS: 1.1 10*3/uL (ref 1.0–3.6)
LYMPHS PCT: 26.6 %
MCH: 30.1 pg (ref 26.0–34.0)
MCHC: 33.9 g/dL (ref 32.0–36.0)
MCV: 89 fL (ref 80–100)
MONO ABS: 0.4 x10 3/mm (ref 0.2–0.9)
MONOS PCT: 8.7 %
Neutrophil #: 2.6 10*3/uL (ref 1.4–6.5)
Neutrophil %: 62.7 %
PLATELETS: 289 10*3/uL (ref 150–440)
RBC: 4.3 10*6/uL (ref 3.80–5.20)
RDW: 13.2 % (ref 11.5–14.5)
WBC: 4.1 10*3/uL (ref 3.6–11.0)

## 2013-02-09 LAB — BASIC METABOLIC PANEL
Anion Gap: 5 — ABNORMAL LOW (ref 7–16)
BUN: 18 mg/dL (ref 7–18)
CO2: 28 mmol/L (ref 21–32)
Calcium, Total: 8.8 mg/dL (ref 8.5–10.1)
Chloride: 99 mmol/L (ref 98–107)
Creatinine: 0.67 mg/dL (ref 0.60–1.30)
EGFR (African American): >60
EGFR (Non-African Amer.): >60
Glucose: 89 mg/dL (ref 65–99)
OSMOLALITY: 266 (ref 275–301)
Potassium: 4 mmol/L (ref 3.5–5.1)
Sodium: 132 mmol/L — ABNORMAL LOW (ref 136–145)

## 2013-02-09 LAB — URINE CULTURE

## 2013-02-09 LAB — CBC WITH DIFFERENTIAL/PLATELET
BASOS PCT: 0.3 %
Basophil #: 0 10*3/uL (ref 0.0–0.1)
EOS PCT: 1.5 %
Eosinophil #: 0.1 10*3/uL (ref 0.0–0.7)
HCT: 29.2 % — ABNORMAL LOW (ref 35.0–47.0)
HGB: 10 g/dL — AB (ref 12.0–16.0)
Lymphocyte #: 0.8 10*3/uL — ABNORMAL LOW (ref 1.0–3.6)
Lymphocyte %: 8.1 %
MCH: 30.6 pg (ref 26.0–34.0)
MCHC: 34.1 g/dL (ref 32.0–36.0)
MCV: 90 fL (ref 80–100)
MONO ABS: 0.3 x10 3/mm (ref 0.2–0.9)
Monocyte %: 3.8 %
Neutrophil #: 8 10*3/uL — ABNORMAL HIGH (ref 1.4–6.5)
Neutrophil %: 86.3 %
Platelet: 206 10*3/uL (ref 150–440)
RBC: 3.26 10*6/uL — AB (ref 3.80–5.20)
RDW: 13 % (ref 11.5–14.5)
WBC: 9.3 10*3/uL (ref 3.6–11.0)

## 2013-02-09 LAB — MAGNESIUM: Magnesium: 1.4 mg/dL — ABNORMAL LOW

## 2013-02-10 LAB — CBC WITH DIFFERENTIAL/PLATELET
BASOS ABS: 0 10*3/uL (ref 0.0–0.1)
Basophil %: 0.4 %
EOS PCT: 4.2 %
Eosinophil #: 0.2 10*3/uL (ref 0.0–0.7)
HCT: 27.4 % — ABNORMAL LOW (ref 35.0–47.0)
HGB: 9.5 g/dL — ABNORMAL LOW (ref 12.0–16.0)
LYMPHS ABS: 0.6 10*3/uL — AB (ref 1.0–3.6)
Lymphocyte %: 10.2 %
MCH: 30.9 pg (ref 26.0–34.0)
MCHC: 34.6 g/dL (ref 32.0–36.0)
MCV: 89 fL (ref 80–100)
MONOS PCT: 6.4 %
Monocyte #: 0.4 x10 3/mm (ref 0.2–0.9)
NEUTROS ABS: 4.7 10*3/uL (ref 1.4–6.5)
Neutrophil %: 78.8 %
Platelet: 175 10*3/uL (ref 150–440)
RBC: 3.07 10*6/uL — ABNORMAL LOW (ref 3.80–5.20)
RDW: 13.3 % (ref 11.5–14.5)
WBC: 5.9 10*3/uL (ref 3.6–11.0)

## 2013-02-20 ENCOUNTER — Emergency Department: Payer: Self-pay | Admitting: Emergency Medicine

## 2013-02-20 LAB — COMPREHENSIVE METABOLIC PANEL
Albumin: 3.2 g/dL — ABNORMAL LOW (ref 3.4–5.0)
Alkaline Phosphatase: 111 U/L
Anion Gap: 6 — ABNORMAL LOW (ref 7–16)
BILIRUBIN TOTAL: 0.6 mg/dL (ref 0.2–1.0)
BUN: 16 mg/dL (ref 7–18)
Calcium, Total: 9.3 mg/dL (ref 8.5–10.1)
Chloride: 97 mmol/L — ABNORMAL LOW (ref 98–107)
Co2: 29 mmol/L (ref 21–32)
Creatinine: 0.71 mg/dL (ref 0.60–1.30)
EGFR (African American): >60
GLUCOSE: 95 mg/dL (ref 65–99)
OSMOLALITY: 266 (ref 275–301)
Potassium: 3.8 mmol/L (ref 3.5–5.1)
SGOT(AST): 15 U/L (ref 15–37)
SGPT (ALT): 14 U/L (ref 12–78)
SODIUM: 132 mmol/L — AB (ref 136–145)
Total Protein: 6.7 g/dL (ref 6.4–8.2)

## 2013-02-20 LAB — CBC
HCT: 27.7 % — ABNORMAL LOW (ref 35.0–47.0)
HGB: 9.2 g/dL — AB (ref 12.0–16.0)
MCH: 30.2 pg (ref 26.0–34.0)
MCHC: 33.2 g/dL (ref 32.0–36.0)
MCV: 91 fL (ref 80–100)
PLATELETS: 356 10*3/uL (ref 150–440)
RBC: 3.04 10*6/uL — AB (ref 3.80–5.20)
RDW: 14.5 % (ref 11.5–14.5)
WBC: 6.2 10*3/uL (ref 3.6–11.0)

## 2013-02-20 LAB — CK TOTAL AND CKMB (NOT AT ARMC)
CK, Total: 56 U/L (ref 21–215)
CK-MB: 1.4 ng/mL (ref 0.5–3.6)

## 2013-02-20 LAB — TROPONIN I

## 2013-02-21 ENCOUNTER — Emergency Department: Payer: Self-pay | Admitting: Emergency Medicine

## 2013-03-17 ENCOUNTER — Emergency Department: Payer: Self-pay | Admitting: Emergency Medicine

## 2013-05-29 ENCOUNTER — Emergency Department: Payer: Self-pay | Admitting: Emergency Medicine

## 2013-05-29 LAB — CBC WITH DIFFERENTIAL/PLATELET
BASOS ABS: 0.1 10*3/uL (ref 0.0–0.1)
Basophil %: 0.9 %
EOS ABS: 0 10*3/uL (ref 0.0–0.7)
EOS PCT: 0.5 %
HCT: 36.2 % (ref 35.0–47.0)
HGB: 11.9 g/dL — AB (ref 12.0–16.0)
LYMPHS PCT: 14.2 %
Lymphocyte #: 0.9 10*3/uL — ABNORMAL LOW (ref 1.0–3.6)
MCH: 29.6 pg (ref 26.0–34.0)
MCHC: 32.9 g/dL (ref 32.0–36.0)
MCV: 90 fL (ref 80–100)
Monocyte #: 0.5 x10 3/mm (ref 0.2–0.9)
Monocyte %: 8.6 %
NEUTROS PCT: 75.8 %
Neutrophil #: 4.6 10*3/uL (ref 1.4–6.5)
Platelet: 351 10*3/uL (ref 150–440)
RBC: 4.02 10*6/uL (ref 3.80–5.20)
RDW: 14.7 % — AB (ref 11.5–14.5)
WBC: 6.1 10*3/uL (ref 3.6–11.0)

## 2013-05-29 LAB — URINALYSIS, COMPLETE
BACTERIA: NONE SEEN
Bilirubin,UR: NEGATIVE
Blood: NEGATIVE
Glucose,UR: NEGATIVE mg/dL (ref 0–75)
Ketone: NEGATIVE
Leukocyte Esterase: NEGATIVE
Nitrite: NEGATIVE
Ph: 7 (ref 4.5–8.0)
Protein: NEGATIVE
RBC,UR: 1 /HPF (ref 0–5)
SPECIFIC GRAVITY: 1.012 (ref 1.003–1.030)

## 2013-05-29 LAB — LIPASE, BLOOD: Lipase: 128 U/L (ref 73–393)

## 2013-05-29 LAB — COMPREHENSIVE METABOLIC PANEL
ALT: 15 U/L (ref 12–78)
AST: 15 U/L (ref 15–37)
Albumin: 2.8 g/dL — ABNORMAL LOW (ref 3.4–5.0)
Alkaline Phosphatase: 106 U/L
Anion Gap: 4 — ABNORMAL LOW (ref 7–16)
BUN: 14 mg/dL (ref 7–18)
Bilirubin,Total: 0.3 mg/dL (ref 0.2–1.0)
CO2: 32 mmol/L (ref 21–32)
Calcium, Total: 9.5 mg/dL (ref 8.5–10.1)
Chloride: 95 mmol/L — ABNORMAL LOW (ref 98–107)
Creatinine: 0.77 mg/dL (ref 0.60–1.30)
EGFR (African American): >60
EGFR (Non-African Amer.): >60
GLUCOSE: 117 mg/dL — AB (ref 65–99)
Osmolality: 264 (ref 275–301)
POTASSIUM: 3.9 mmol/L (ref 3.5–5.1)
SODIUM: 131 mmol/L — AB (ref 136–145)
Total Protein: 6.9 g/dL (ref 6.4–8.2)

## 2013-05-29 LAB — TROPONIN I

## 2013-06-12 ENCOUNTER — Emergency Department: Payer: Self-pay | Admitting: Emergency Medicine

## 2013-06-12 LAB — CBC
HCT: 36.3 % (ref 35.0–47.0)
HGB: 12 g/dL (ref 12.0–16.0)
MCH: 29.8 pg (ref 26.0–34.0)
MCHC: 33.2 g/dL (ref 32.0–36.0)
MCV: 90 fL (ref 80–100)
Platelet: 295 10*3/uL (ref 150–440)
RBC: 4.04 10*6/uL (ref 3.80–5.20)
RDW: 14.8 % — AB (ref 11.5–14.5)
WBC: 5.4 10*3/uL (ref 3.6–11.0)

## 2013-06-12 LAB — COMPREHENSIVE METABOLIC PANEL
Albumin: 3 g/dL — ABNORMAL LOW (ref 3.4–5.0)
Alkaline Phosphatase: 87 U/L
Anion Gap: 2 — ABNORMAL LOW (ref 7–16)
BUN: 12 mg/dL (ref 7–18)
Bilirubin,Total: 0.4 mg/dL (ref 0.2–1.0)
CALCIUM: 9.6 mg/dL (ref 8.5–10.1)
CO2: 31 mmol/L (ref 21–32)
CREATININE: 0.75 mg/dL (ref 0.60–1.30)
Chloride: 94 mmol/L — ABNORMAL LOW (ref 98–107)
EGFR (African American): >60
Glucose: 89 mg/dL (ref 65–99)
Osmolality: 254 (ref 275–301)
Potassium: 4.2 mmol/L (ref 3.5–5.1)
SGOT(AST): 23 U/L (ref 15–37)
SGPT (ALT): 14 U/L (ref 12–78)
Sodium: 127 mmol/L — ABNORMAL LOW (ref 136–145)
Total Protein: 7.3 g/dL (ref 6.4–8.2)

## 2013-06-12 LAB — URINALYSIS, COMPLETE
Bacteria: NONE SEEN
Bilirubin,UR: NEGATIVE
Blood: NEGATIVE
Glucose,UR: NEGATIVE mg/dL (ref 0–75)
Ketone: NEGATIVE
LEUKOCYTE ESTERASE: NEGATIVE
Nitrite: NEGATIVE
Ph: 7 (ref 4.5–8.0)
Protein: NEGATIVE
RBC,UR: 1 /HPF (ref 0–5)
SPECIFIC GRAVITY: 1.011 (ref 1.003–1.030)
Squamous Epithelial: <1
WBC UR: <1 /HPF (ref 0–5)

## 2013-06-18 ENCOUNTER — Emergency Department: Payer: Self-pay | Admitting: Emergency Medicine

## 2013-08-15 ENCOUNTER — Emergency Department: Payer: Self-pay | Admitting: Emergency Medicine

## 2013-08-15 DIAGNOSIS — S062X9A Diffuse traumatic brain injury with loss of consciousness of unspecified duration, initial encounter: Secondary | ICD-10-CM | POA: Insufficient documentation

## 2013-08-15 DIAGNOSIS — S062XAA Diffuse traumatic brain injury with loss of consciousness status unknown, initial encounter: Secondary | ICD-10-CM | POA: Insufficient documentation

## 2013-08-15 DIAGNOSIS — S065X9A Traumatic subdural hemorrhage with loss of consciousness of unspecified duration, initial encounter: Secondary | ICD-10-CM | POA: Insufficient documentation

## 2013-08-15 DIAGNOSIS — S065XAA Traumatic subdural hemorrhage with loss of consciousness status unknown, initial encounter: Secondary | ICD-10-CM | POA: Insufficient documentation

## 2013-08-15 LAB — URINALYSIS, COMPLETE
Bilirubin,UR: NEGATIVE
Glucose,UR: NEGATIVE mg/dL (ref 0–75)
Ketone: NEGATIVE
Nitrite: NEGATIVE
PROTEIN: NEGATIVE
Ph: 7 (ref 4.5–8.0)
RBC,UR: 11 /HPF (ref 0–5)
SPECIFIC GRAVITY: 1.01 (ref 1.003–1.030)

## 2013-08-15 LAB — BASIC METABOLIC PANEL
Anion Gap: 7 (ref 7–16)
BUN: 14 mg/dL (ref 7–18)
CALCIUM: 9.6 mg/dL (ref 8.5–10.1)
CO2: 28 mmol/L (ref 21–32)
Chloride: 86 mmol/L — ABNORMAL LOW (ref 98–107)
Creatinine: 0.61 mg/dL (ref 0.60–1.30)
EGFR (Non-African Amer.): >60
GLUCOSE: 109 mg/dL — AB (ref 65–99)
OSMOLALITY: 245 (ref 275–301)
POTASSIUM: 4 mmol/L (ref 3.5–5.1)
Sodium: 121 mmol/L — ABNORMAL LOW (ref 136–145)

## 2013-08-15 LAB — CBC
HCT: 34.4 % — AB (ref 35.0–47.0)
HGB: 11.1 g/dL — ABNORMAL LOW (ref 12.0–16.0)
MCH: 28.7 pg (ref 26.0–34.0)
MCHC: 32.2 g/dL (ref 32.0–36.0)
MCV: 89 fL (ref 80–100)
PLATELETS: 374 10*3/uL (ref 150–440)
RBC: 3.86 10*6/uL (ref 3.80–5.20)
RDW: 14.1 % (ref 11.5–14.5)
WBC: 14.4 10*3/uL — ABNORMAL HIGH (ref 3.6–11.0)

## 2013-08-15 LAB — TROPONIN I: Troponin-I: <0.02 ng/mL

## 2013-11-29 ENCOUNTER — Encounter (HOSPITAL_COMMUNITY): Payer: Self-pay | Admitting: Emergency Medicine

## 2013-11-29 ENCOUNTER — Emergency Department (HOSPITAL_COMMUNITY): Payer: Medicare Other

## 2013-11-29 ENCOUNTER — Inpatient Hospital Stay (HOSPITAL_COMMUNITY)
Admission: EM | Admit: 2013-11-29 | Discharge: 2013-12-02 | DRG: 065 | Disposition: A | Discharge status: Hospice | Payer: Medicare Other | Attending: Internal Medicine | Admitting: Internal Medicine

## 2013-11-29 DIAGNOSIS — F419 Anxiety disorder, unspecified: Secondary | ICD-10-CM | POA: Diagnosis present

## 2013-11-29 DIAGNOSIS — K117 Disturbances of salivary secretion: Secondary | ICD-10-CM

## 2013-11-29 DIAGNOSIS — I2510 Atherosclerotic heart disease of native coronary artery without angina pectoris: Secondary | ICD-10-CM | POA: Diagnosis present

## 2013-11-29 DIAGNOSIS — M810 Age-related osteoporosis without current pathological fracture: Secondary | ICD-10-CM | POA: Diagnosis present

## 2013-11-29 DIAGNOSIS — R413 Other amnesia: Secondary | ICD-10-CM | POA: Insufficient documentation

## 2013-11-29 DIAGNOSIS — R0602 Shortness of breath: Secondary | ICD-10-CM

## 2013-11-29 DIAGNOSIS — E785 Hyperlipidemia, unspecified: Secondary | ICD-10-CM | POA: Diagnosis present

## 2013-11-29 DIAGNOSIS — I10 Essential (primary) hypertension: Secondary | ICD-10-CM | POA: Diagnosis present

## 2013-11-29 DIAGNOSIS — G8194 Hemiplegia, unspecified affecting left nondominant side: Secondary | ICD-10-CM | POA: Diagnosis present

## 2013-11-29 DIAGNOSIS — M1990 Unspecified osteoarthritis, unspecified site: Secondary | ICD-10-CM | POA: Insufficient documentation

## 2013-11-29 DIAGNOSIS — I63511 Cerebral infarction due to unspecified occlusion or stenosis of right middle cerebral artery: Principal | ICD-10-CM | POA: Diagnosis present

## 2013-11-29 DIAGNOSIS — J45909 Unspecified asthma, uncomplicated: Secondary | ICD-10-CM | POA: Diagnosis present

## 2013-11-29 DIAGNOSIS — R531 Weakness: Secondary | ICD-10-CM

## 2013-11-29 DIAGNOSIS — F0391 Unspecified dementia with behavioral disturbance: Secondary | ICD-10-CM | POA: Diagnosis present

## 2013-11-29 DIAGNOSIS — Z882 Allergy status to sulfonamides status: Secondary | ICD-10-CM

## 2013-11-29 DIAGNOSIS — R52 Pain, unspecified: Secondary | ICD-10-CM | POA: Diagnosis present

## 2013-11-29 DIAGNOSIS — E871 Hypo-osmolality and hyponatremia: Secondary | ICD-10-CM | POA: Diagnosis present

## 2013-11-29 DIAGNOSIS — Z515 Encounter for palliative care: Secondary | ICD-10-CM | POA: Diagnosis not present

## 2013-11-29 DIAGNOSIS — R0600 Dyspnea, unspecified: Secondary | ICD-10-CM

## 2013-11-29 DIAGNOSIS — F329 Major depressive disorder, single episode, unspecified: Secondary | ICD-10-CM | POA: Diagnosis present

## 2013-11-29 DIAGNOSIS — R9430 Abnormal result of cardiovascular function study, unspecified: Secondary | ICD-10-CM

## 2013-11-29 DIAGNOSIS — F32A Depression, unspecified: Secondary | ICD-10-CM | POA: Insufficient documentation

## 2013-11-29 DIAGNOSIS — Z66 Do not resuscitate: Secondary | ICD-10-CM | POA: Diagnosis present

## 2013-11-29 DIAGNOSIS — D649 Anemia, unspecified: Secondary | ICD-10-CM

## 2013-11-29 DIAGNOSIS — I639 Cerebral infarction, unspecified: Secondary | ICD-10-CM | POA: Diagnosis not present

## 2013-11-29 DIAGNOSIS — R471 Dysarthria and anarthria: Secondary | ICD-10-CM

## 2013-11-29 DIAGNOSIS — I429 Cardiomyopathy, unspecified: Secondary | ICD-10-CM

## 2013-11-29 HISTORY — DX: Encephalopathy, unspecified: G93.40

## 2013-11-29 HISTORY — DX: Muscle weakness (generalized): M62.81

## 2013-11-29 HISTORY — DX: Depression, unspecified: F32.A

## 2013-11-29 HISTORY — DX: Age-related osteoporosis without current pathological fracture: M81.0

## 2013-11-29 HISTORY — DX: Essential (primary) hypertension: I10

## 2013-11-29 HISTORY — DX: Urinary tract infection, site not specified: N39.0

## 2013-11-29 HISTORY — DX: Unspecified dementia, unspecified severity, without behavioral disturbance, psychotic disturbance, mood disturbance, and anxiety: F03.90

## 2013-11-29 HISTORY — DX: Hyperlipidemia, unspecified: E78.5

## 2013-11-29 HISTORY — DX: Hypo-osmolality and hyponatremia: E87.1

## 2013-11-29 HISTORY — DX: Unspecified asthma, uncomplicated: J45.909

## 2013-11-29 HISTORY — DX: Major depressive disorder, single episode, unspecified: F32.9

## 2013-11-29 HISTORY — DX: Atherosclerotic heart disease of native coronary artery without angina pectoris: I25.10

## 2013-11-29 LAB — COMPREHENSIVE METABOLIC PANEL
ALBUMIN: 3.2 g/dL — AB (ref 3.5–5.2)
ALK PHOS: 62 U/L (ref 39–117)
ALT: 8 U/L (ref 0–35)
AST: 15 U/L (ref 0–37)
Anion gap: 11 (ref 5–15)
BUN: 16 mg/dL (ref 6–23)
CO2: 28 mEq/L (ref 19–32)
Calcium: 9.4 mg/dL (ref 8.4–10.5)
Chloride: 90 mEq/L — ABNORMAL LOW (ref 96–112)
Creatinine, Ser: 0.54 mg/dL (ref 0.50–1.10)
GFR calc Af Amer: >90 mL/min (ref >90)
GFR calc non Af Amer: 81 mL/min — ABNORMAL LOW (ref >90)
Glucose, Bld: 145 mg/dL — ABNORMAL HIGH (ref 70–99)
POTASSIUM: 4.2 meq/L (ref 3.7–5.3)
Sodium: 129 mEq/L — ABNORMAL LOW (ref 137–147)
TOTAL PROTEIN: 6.7 g/dL (ref 6.0–8.3)

## 2013-11-29 LAB — URINALYSIS, ROUTINE W REFLEX MICROSCOPIC
Bilirubin Urine: NEGATIVE
Glucose, UA: NEGATIVE mg/dL
HGB URINE DIPSTICK: NEGATIVE
Ketones, ur: NEGATIVE mg/dL
Leukocytes, UA: NEGATIVE
Nitrite: NEGATIVE
PROTEIN: NEGATIVE mg/dL
Specific Gravity, Urine: 1.009 (ref 1.005–1.030)
UROBILINOGEN UA: 0.2 mg/dL (ref 0.0–1.0)
pH: 8.5 — ABNORMAL HIGH (ref 5.0–8.0)

## 2013-11-29 LAB — CBC
HCT: 29.2 % — ABNORMAL LOW (ref 36.0–46.0)
Hemoglobin: 9.8 g/dL — ABNORMAL LOW (ref 12.0–15.0)
MCH: 31.5 pg (ref 26.0–34.0)
MCHC: 33.6 g/dL (ref 30.0–36.0)
MCV: 93.9 fL (ref 78.0–100.0)
Platelets: 312 10*3/uL (ref 150–400)
RBC: 3.11 MIL/uL — ABNORMAL LOW (ref 3.87–5.11)
RDW: 14.9 % (ref 11.5–15.5)
WBC: 7 10*3/uL (ref 4.0–10.5)

## 2013-11-29 LAB — APTT: aPTT: 29 seconds (ref 24–37)

## 2013-11-29 LAB — DIFFERENTIAL
BASOS ABS: 0 10*3/uL (ref 0.0–0.1)
BASOS PCT: 0 % (ref 0–1)
Eosinophils Absolute: 0 10*3/uL (ref 0.0–0.7)
Eosinophils Relative: 0 % (ref 0–5)
Lymphocytes Relative: 7 % — ABNORMAL LOW (ref 12–46)
Lymphs Abs: 0.5 10*3/uL — ABNORMAL LOW (ref 0.7–4.0)
Monocytes Absolute: 0.6 10*3/uL (ref 0.1–1.0)
Monocytes Relative: 8 % (ref 3–12)
NEUTROS PCT: 85 % — AB (ref 43–77)
Neutro Abs: 5.9 10*3/uL (ref 1.7–7.7)

## 2013-11-29 LAB — RAPID URINE DRUG SCREEN, HOSP PERFORMED
AMPHETAMINES: NOT DETECTED
BENZODIAZEPINES: NOT DETECTED
Barbiturates: NOT DETECTED
Cocaine: NOT DETECTED
OPIATES: NOT DETECTED
TETRAHYDROCANNABINOL: NOT DETECTED

## 2013-11-29 LAB — I-STAT CHEM 8, ED
BUN: 16 mg/dL (ref 6–23)
CHLORIDE: 89 meq/L — AB (ref 96–112)
CREATININE: 0.7 mg/dL (ref 0.50–1.10)
Calcium, Ion: 1.18 mmol/L (ref 1.13–1.30)
GLUCOSE: 148 mg/dL — AB (ref 70–99)
HCT: 33 % — ABNORMAL LOW (ref 36.0–46.0)
HEMOGLOBIN: 11.2 g/dL — AB (ref 12.0–15.0)
POTASSIUM: 4 meq/L (ref 3.7–5.3)
Sodium: 127 mEq/L — ABNORMAL LOW (ref 137–147)
TCO2: 27 mmol/L (ref 0–100)

## 2013-11-29 LAB — I-STAT TROPONIN, ED: Troponin i, poc: 0 ng/mL (ref 0.00–0.08)

## 2013-11-29 LAB — PROTIME-INR
INR: 1.02 (ref 0.00–1.49)
PROTHROMBIN TIME: 13.5 s (ref 11.6–15.2)

## 2013-11-29 LAB — ETHANOL: Alcohol, Ethyl (B): <11 mg/dL (ref 0–11)

## 2013-11-29 LAB — CBG MONITORING, ED: GLUCOSE-CAPILLARY: 175 mg/dL — AB (ref 70–99)

## 2013-11-29 MED ORDER — SODIUM CHLORIDE 0.9 % IV BOLUS (SEPSIS)
500.0000 mL | Freq: Once | INTRAVENOUS | Status: AC
  Administered 2013-11-29: 500 mL via INTRAVENOUS

## 2013-11-29 MED ORDER — ASPIRIN 300 MG RE SUPP: 150.0000 mg | Freq: Once | RECTAL | Status: DC

## 2013-11-29 MED ORDER — ASPIRIN EC 81 MG PO TBEC
81.0000 mg | DELAYED_RELEASE_TABLET | Freq: Every day | ORAL | Status: DC
  Filled 2013-11-29: qty 1

## 2013-11-29 MED ORDER — ASPIRIN 300 MG RE SUPP
150.0000 mg | Freq: Once | RECTAL | Status: AC
  Administered 2013-11-29: 150 mg via RECTAL
  Filled 2013-11-29: qty 1

## 2013-11-29 MED ORDER — SODIUM CHLORIDE 0.9 % IV SOLN
INTRAVENOUS | Status: AC
  Administered 2013-11-29: 21:00:00 via INTRAVENOUS

## 2013-11-29 NOTE — ED Notes (Signed)
Pt from [facility] in [place] with c/o left sided weakness, incomprehensible speech, and unable to follow commands.  Pt LSW at 745 after returning to room from breakfast.  Staff reports she is normally A&O, able to transfer herself from her wheelchair to bed.  Pt in NAD, alert.

## 2013-11-29 NOTE — H&P (Signed)
Date: 11/29/2013               Patient Name:  Chloe GableLois C Gonzalez MRN: [id number]  DOB: 10/21/1922 Age / Sex: 80 y.o., female   PCP: No primary provider on file.         Medical Service: Internal Medicine Teaching Service         Attending Physician: Dr. Earl LagosNischal Narendra, MD    First Contact: Dr. Rich Numberarly Kiylee Thoreson Pager: [phone]  Second Contact: Dr. Evelena PeatAlex Wilson Pager: [phone]       After Hours (After 5p/  First Contact Pager: [phone]  weekends / holidays): Second Contact Pager: [phone]   Chief Complaint: Altered mental status   History of Present Illness: [name] is a 80yo woman, resident at [facility], w/ PMHx of HTN, HLD, CAD, dementia, depression, hyponatremia, and recent hospitalization for a small left parietal acute subdural hematoma after a fall on 08/15/13 who presented to the ED with altered mental status. History was obtained through medical records and family members. Per nursing staff at ALF, patient was noted to be in normal state of health at 7:45 this morning, had breakfast, and then was returning to her room when she was found to have left-sided weakness and "garbled" speech. EMS was called and brought the patient to the hospital. Per patient's brother and sister-in-law, they saw the patient yesterday at the ALF and she was more tired than usual, but able to speak with them clearly.   In the ED, neurology was consulted. CT Head showed loss of gray-white differentiation in the posterior right frontal and anterior right parietal lobe, concerning for acute or early subacute ischemia in the MCA distribution. Patient was not a candidate for IV thrombolysis given recent cerebral bleeding.    Meds: No current facility-administered medications for this encounter.   Current Outpatient Prescriptions  Medication Sig Dispense Refill  . acetaminophen (TYLENOL) 325 MG tablet Take 650 mg by mouth 3 (three) times daily.      Marland Kitchen. albuterol (PROVENTIL HFA;VENTOLIN HFA) 108 (90 BASE) MCG/ACT  inhaler Inhale 2 puffs into the lungs every 6 (six) hours.      . calcium-vitamin D (OSCAL WITH D) 500-200 MG-UNIT per tablet Take 1 tablet by mouth daily with breakfast.      . ferrous sulfate 325 (65 FE) MG tablet Take 325 mg by mouth 2 (two) times daily with a meal.      . fluticasone (FLONASE) 50 MCG/ACT nasal spray Place 2 sprays into both nostrils.      . hydrochlorothiazide (HYDRODIURIL) 25 MG tablet Take 25 mg by mouth daily.      Marland Kitchen. loratadine (CLARITIN) 10 MG tablet Take 10 mg by mouth daily.      . magnesium hydroxide (MILK OF MAGNESIA) 400 MG/5ML suspension Take 10 mLs by mouth every 12 (twelve) hours.      . meclizine (ANTIVERT) 25 MG tablet Take 25 mg by mouth 3 (three) times daily as needed for dizziness.      . metoprolol succinate (TOPROL-XL) 50 MG 24 hr tablet Take 50 mg by mouth 2 (two) times daily. Take with or immediately following a meal.      . mirtazapine (REMERON) 7.5 MG tablet Take 7.5 mg by mouth at bedtime.      Marland Kitchen. omeprazole (PRILOSEC) 20 MG capsule Take 20 mg by mouth daily.      Marland Kitchen. senna (SENOKOT) 8.6 MG TABS tablet Take 1 tablet by mouth 2 (two) times daily.      .Marland Kitchen  vitamin B-12 (CYANOCOBALAMIN) 1000 MCG tablet Take 1,000 mcg by mouth daily.        Allergies: Allergies as of 11/29/2013 - Review Complete 11/29/2013  Allergen Reaction Noted  . Ciprofloxacin  11/29/2013  . Doxycycline  11/29/2013  . Lorazepam  11/29/2013  . Paroxetine  11/29/2013  . Sulfa antibiotics  11/29/2013  . Trazodone and nefazodone  11/29/2013   Past Medical History  Diagnosis Date  . Hypertension   . Acute encephalopathy   . Dementia   . Hyperlipemia   . Hyponatremia   . CAD (coronary artery disease)   . UTI (lower urinary tract infection)   . Depressive disorder   . Osteoporosis   . Asthma   . Muscle weakness    History reviewed. No pertinent past surgical history. History reviewed. No pertinent family history. History   Social History  . Marital Status: Widowed     Spouse Name: N/A    Number of Children: N/A  . Years of Education: N/A   Occupational History  . Not on file.   Social History Main Topics  . Smoking status: Unknown If Ever Smoked  . Smokeless tobacco: Not on file  . Alcohol Use: No  . Drug Use: No  . Sexual Activity: No   Other Topics Concern  . Not on file   Social History Narrative  . No narrative on file    Review of Systems: Unable to perform ROS given patient's mental status  Physical Exam: Blood pressure 159/62, pulse 67, resp. rate 26, SpO2 98.00%. General: awake, does not follow commands, dysarthria HEENT: [place]/AT, PERRL, unable to assess EOM, ptosis not present Neck: supple, no JVD CV: RRR, normal S1/S2, no m/g/r Pulm: CTA bilaterally, breaths non-labored Abd: BS+, soft, non-tender Ext: warm, no edema Neuro: awake, smile asymmetric, does not follow commands, unable to assess strength  Lab results: Basic Metabolic Panel:  Recent Labs  16/10/96 1021 11/29/13 1028  NA 129* 127*  K 4.2 4.0  CL 90* 89*  CO2 28  --   GLUCOSE 145* 148*  BUN 16 16  CREATININE 0.54 0.70  CALCIUM 9.4  --    Liver Function Tests:  Recent Labs  11/29/13 1021  AST 15  ALT 8  ALKPHOS 62  BILITOT <0.2*  PROT 6.7  ALBUMIN 3.2*   No results found for this basename: LIPASE, AMYLASE,  in the last 72 hours No results found for this basename: AMMONIA,  in the last 72 hours CBC:  Recent Labs  11/29/13 1021 11/29/13 1028  WBC 7.0  --   NEUTROABS 5.9  --   HGB 9.8* 11.2*  HCT 29.2* 33.0*  MCV 93.9  --   PLT 312  --    Cardiac Enzymes: No results found for this basename: CKTOTAL, CKMB, CKMBINDEX, TROPONINI,  in the last 72 hours BNP: No results found for this basename: PROBNP,  in the last 72 hours D-Dimer: No results found for this basename: DDIMER,  in the last 72 hours CBG:  Recent Labs  11/29/13 1021  GLUCAP 175*   Hemoglobin A1C: No results found for this basename: HGBA1C,  in the last 72 hours Fasting  Lipid Panel: No results found for this basename: CHOL, HDL, LDLCALC, TRIG, CHOLHDL, LDLDIRECT,  in the last 72 hours Thyroid Function Tests: No results found for this basename: TSH, T4TOTAL, FREET4, T3FREE, THYROIDAB,  in the last 72 hours Anemia Panel: No results found for this basename: VITAMINB12, FOLATE, FERRITIN, TIBC, IRON, RETICCTPCT,  in the  last 72 hours Coagulation:  Recent Labs  11/29/13 1021  LABPROT 13.5  INR 1.02   Urine Drug Screen: Drugs of Abuse  No results found for this basename: labopia, cocainscrnur, labbenz, amphetmu, thcu, labbarb    Alcohol Level:  Recent Labs  11/29/13 1021  ETH <11   Urinalysis: No results found for this basename: COLORURINE, APPERANCEUR, LABSPEC, PHURINE, GLUCOSEU, HGBUR, BILIRUBINUR, KETONESUR, PROTEINUR, UROBILINOGEN, NITRITE, LEUKOCYTESUR,  in the last 72 hours   Imaging results:  Ct Head Wo Contrast  11/29/2013   CLINICAL DATA:  80 year old female presenting with acute onset of left-sided weakness and garbled speech. Code stroke.  EXAM: CT HEAD WITHOUT CONTRAST  TECHNIQUE: Contiguous axial images were obtained from the base of the skull through the vertex without intravenous contrast.  COMPARISON:  No priors.  FINDINGS: There is loss of gray-white differentiation in the posterior right frontal and anterior right parietal lobe, concerning for acute or early subacute ischemia. Additionally, there is loss of gray-white differentiation in the region of the right insular ribbon. No signs of acute intracranial hemorrhage. No focal mass, mass effect, hydrocephalus or abnormal intra or extra-axial fluid collections. Mild cerebral atrophy. Patchy and confluent areas of decreased attenuation are noted throughout the deep and periventricular white matter of the cerebral hemispheres bilaterally, compatible with chronic microvascular ischemic disease. Visualized paranasal sinuses and mastoids are well pneumatized, with exception of some mild  multifocal mucosal thickening in the ethmoid sinuses bilaterally. No acute displaced skull fractures.  IMPRESSION: 1. Findings, as above, concerning for acute or early subacute ischemia in the right MCA distribution. 2. Mild cerebral atrophy with chronic microvascular ischemic changes in the cerebral white matter. Critical Value/emergent results were called by telephone at the time of interpretation on 11/29/2013 at 10:36 am to Dr. Leroy Kennedyamilo, who verbally acknowledged these results.   Electronically Signed   By: Trudie Reedaniel  Entrikin M.D.   On: 11/29/2013 10:39   Dg Chest Portable 1 View  11/29/2013   CLINICAL DATA:  Stroke. Patient with left-sided weakness and incomprehensible speech. Unable to follow commands.  EXAM: PORTABLE CHEST - 1 VIEW  COMPARISON:  08/12/2009  FINDINGS: Semi-erect portable study. Cardiac silhouette is normal in size. Aorta is mildly uncoiled. No mediastinal or hilar masses. Lungs are clear. No pleural effusion or pneumothorax.  Bony thorax is demineralized but grossly intact.  IMPRESSION: No acute cardiopulmonary disease.   Electronically Signed   By: Amie Portlandavid  Ormond M.D.   On: 11/29/2013 11:59    Other results: EKG: sinus rhythm, old anterior infarct, LVH  Assessment & Plan: [name] is a 80yo woman, resident at [facility], w/ PMHx of HTN, HLD, CAD, dementia, depression, hyponatremia, and recent hospitalization for a small left parietal acute subdural hematoma after a fall on 08/15/13 who presented to the ED with altered mental status 2/2 right MCA stroke.  1. Acute Right MCA Stroke: Patient presented with AMS after being found with left-sided weakness and dysarthria at 9 AM this morning. Patient was in normal state of health at 7:45 AM per nursing facility staff. On exam, patient with dysarthria and unable to follow commands. She was found to have right MCA stroke on CT. She is not a candidate for TPA given recent intracranial bleeding. Will do full stroke work up.  - Neurology on  board, appreciate recommendations  - MRI/MRA Head  - Carotid doppler  - 2D Echocardiogram  - NPO  - Cardiac monitoring  - Neuro checks Q2H - HbA1c  - Lipid panel  -  PT eval  - SLP eval  2. Hyponatremia: Na 129 on admission. During last hospitalization at [facility] in July 2015, patient found to be hyponatremic at 120 on admission. She was given 1.5% hypertonic saline and her sodium remained stable at 127-128 after discontinuation of the hypertonic saline. 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 She was discharged with instructions to continue free water restriction.  - Continue to monitor  3. HTN: BP 137/74 on admission. BP has remained stable in 130s-150s systolic. She takes HCTZ 25 mg daily and Metoprolol 50 mg BID at home. - Hold home meds for now given acute stroke  4. Dementia: Patient lives at ALF. She is normally able to talk and feed herself per family. She gets around by wheelchair. She is not on any home medications for dementia. Will continue to monitor her status.  5. Depression: Patient takes Mirtazepine 7.5 mg QHS at home.  - Hold home meds for now  Diet: NPO, swallow eval DVT/PE PPx: SCDs Dispo: Disposition is deferred at this time, awaiting improvement of current medical problems. Anticipated discharge in approximately 1-2 day(s).   The patient does not have a current PCP (No primary provider on file.) and does need an [facility] hospital follow-up appointment after discharge.  The patient does not have transportation limitations that hinder transportation to clinic appointments.  Signed: Rich Number, MD 11/29/2013, 12:49 PM

## 2013-11-29 NOTE — Progress Notes (Addendum)
Nurse notified MD, Dr. Beckie Saltsivet, of pt's family members  DNR orders and that pt has gurgling lung sounds, 100% O2 sat on room air, HR 70-80s, no acute distress noted.  No new orders. Nurse oral suctioned pt. Small amount of saliva noted in suction tubing.  Will continue to monitor pt closely.   Andrew AuVafiadis, Yancy Knoble I 11/29/2013 4:25 PM

## 2013-11-29 NOTE — Progress Notes (Signed)
Pt is agitated and pulled off all telemetry leads, blood pressure cuff, and gown. Pt visibly agitated. Pt gurgling.  Nurse oral suctioned, small amount of saliva observed in suction tubing. Nurse and nurse tech, Hardie LoraLavern, at bedside. Nurse called telemetry monitoring service and informed that pt keeps pulling off leads and to place on stand-by. Nurse paged Dr. Beckie Saltsivet. Md called back and nurse informed. No new orders. Will continue to closely monitor pt.  Andrew AuVafiadis, Terrace Chiem I 11/29/2013 5:37 PM

## 2013-11-29 NOTE — Code Documentation (Signed)
Code stroke called at 1002, Patient arrived to [facility] ED via [facility] at 1016.  Patient from [facility], nursing facility in [place].  This RN [facility] as per staff at nursing facility, LSN [phone], staff came in her room to give her a snack and found her slumped over, left side weakness and incomprehensible speech.  NIHSS 16.

## 2013-11-29 NOTE — ED Provider Notes (Signed)
CSN: [id number]     Arrival date & time 11/29/13  1016 History   First MD Initiated Contact with Patient 11/29/13 1018     Chief Complaint  Patient presents with  . Code Stroke     (Consider location/radiation/quality/duration/timing/severity/associated sxs/prior Treatment) HPI Comments: The patient is an 80 year old female who comes from a nursing facility where the staff state that she was normal at 7:45 in the morning, she was then found to have garbled speech and unable to ambulate. At baseline the patient is able to get herself from her wheelchair to the bed. The patient is unable to communicate at all. Paramedics called a "code stroke" in the field.  The history is provided by the EMS personnel.    Past Medical History  Diagnosis Date  . Hypertension   . Acute encephalopathy   . Dementia   . Hyperlipemia   . Hyponatremia   . CAD (coronary artery disease)   . UTI (lower urinary tract infection)   . Depressive disorder   . Osteoporosis   . Asthma   . Muscle weakness    History reviewed. No pertinent past surgical history. History reviewed. No pertinent family history. History  Substance Use Topics  . Smoking status: Unknown If Ever Smoked  . Smokeless tobacco: Not on file  . Alcohol Use: No   OB History   Grav Para Term Preterm Abortions TAB SAB Ect Mult Living                 Review of Systems  Unable to perform ROS: Mental status change      Allergies  Ciprofloxacin; Doxycycline; Lorazepam; Paroxetine; Sulfa antibiotics; and Trazodone and nefazodone  Home Medications   Prior to Admission medications   Medication Sig Start Date End Date Taking? Authorizing Provider  acetaminophen (TYLENOL) 325 MG tablet Take 650 mg by mouth 3 (three) times daily.   Yes Historical Provider, MD  albuterol (PROVENTIL HFA;VENTOLIN HFA) 108 (90 BASE) MCG/ACT inhaler Inhale 2 puffs into the lungs every 6 (six) hours.   Yes Historical Provider, MD  calcium-vitamin D (OSCAL WITH  D) 500-200 MG-UNIT per tablet Take 1 tablet by mouth daily with breakfast.   Yes Historical Provider, MD  ferrous sulfate 325 (65 FE) MG tablet Take 325 mg by mouth 2 (two) times daily with a meal.   Yes Historical Provider, MD  fluticasone (FLONASE) 50 MCG/ACT nasal spray Place 2 sprays into both nostrils.   Yes Historical Provider, MD  hydrochlorothiazide (HYDRODIURIL) 25 MG tablet Take 25 mg by mouth daily.   Yes Historical Provider, MD  loratadine (CLARITIN) 10 MG tablet Take 10 mg by mouth daily.   Yes Historical Provider, MD  magnesium hydroxide (MILK OF MAGNESIA) 400 MG/5ML suspension Take 10 mLs by mouth every 12 (twelve) hours.   Yes Historical Provider, MD  meclizine (ANTIVERT) 25 MG tablet Take 25 mg by mouth 3 (three) times daily as needed for dizziness.   Yes Historical Provider, MD  metoprolol succinate (TOPROL-XL) 50 MG 24 hr tablet Take 50 mg by mouth 2 (two) times daily. Take with or immediately following a meal.   Yes Historical Provider, MD  mirtazapine (REMERON) 7.5 MG tablet Take 7.5 mg by mouth at bedtime.   Yes Historical Provider, MD  omeprazole (PRILOSEC) 20 MG capsule Take 20 mg by mouth daily.   Yes Historical Provider, MD  senna (SENOKOT) 8.6 MG TABS tablet Take 1 tablet by mouth 2 (two) times daily.   Yes Historical Provider, MD  vitamin B-12 (CYANOCOBALAMIN) 1000 MCG tablet Take 1,000 mcg by mouth daily.   Yes Historical Provider, MD   BP 135/60  Pulse 69  Resp 19  SpO2 99% Physical Exam  Nursing note and vitals reviewed. Constitutional: She appears well-developed and well-nourished. She appears distressed.  HENT:  Head: Normocephalic and atraumatic.  Mouth/Throat: Oropharynx is clear and moist. No oropharyngeal exudate.  Eyes: Conjunctivae and EOM are normal. Pupils are equal, round, and reactive to light. Right eye exhibits no discharge. Left eye exhibits no discharge. No scleral icterus.  The patient does not follow extraocular movement commands  Neck: Normal  range of motion. Neck supple. No JVD present. No thyromegaly present.  Cardiovascular: Normal rate, regular rhythm, normal heart sounds and intact distal pulses.  Exam reveals no gallop and no friction rub.   No murmur heard. Pulmonary/Chest: Effort normal and breath sounds normal. No respiratory distress. She has no wheezes. She has no rales.  Abdominal: Soft. Bowel sounds are normal. She exhibits no distension and no mass. There is no tenderness.  Musculoskeletal: Normal range of motion. She exhibits no edema and no tenderness.  Lymphadenopathy:    She has no cervical adenopathy.  Neurological: She is alert. Coordination normal.  The patient does not follow commands with her arms or her legs, she does mumble garbled nonsense for speech,  She does not follow commands however she does withdraw with all 4 extremities, she is mumbling, she avoids painful stimuli, her eyes do not cross midline spontaneously but when distracted she will.  Skin: Skin is warm and dry. No rash noted. No erythema.  Psychiatric: She has a normal mood and affect. Her behavior is normal.    ED Course  Procedures (including critical care time) Labs Review Labs Reviewed  CBC - Abnormal; Notable for the following:    RBC 3.11 (*)    Hemoglobin 9.8 (*)    HCT 29.2 (*)    All other components within normal limits  DIFFERENTIAL - Abnormal; Notable for the following:    Neutrophils Relative % 85 (*)    Lymphocytes Relative 7 (*)    Lymphs Abs 0.5 (*)    All other components within normal limits  COMPREHENSIVE METABOLIC PANEL - Abnormal; Notable for the following:    Sodium 129 (*)    Chloride 90 (*)    Glucose, Bld 145 (*)    Albumin 3.2 (*)    Total Bilirubin <0.2 (*)    GFR calc non Af Amer 81 (*)    All other components within normal limits  I-STAT CHEM 8, ED - Abnormal; Notable for the following:    Sodium 127 (*)    Chloride 89 (*)    Glucose, Bld 148 (*)    Hemoglobin 11.2 (*)    HCT 33.0 (*)    All  other components within normal limits  CBG MONITORING, ED - Abnormal; Notable for the following:    Glucose-Capillary 175 (*)    All other components within normal limits  ETHANOL  PROTIME-INR  APTT  URINE RAPID DRUG SCREEN (HOSP PERFORMED)  URINALYSIS, ROUTINE W REFLEX MICROSCOPIC  HEMOGLOBIN A1C  I-STAT TROPOININ, ED  I-STAT TROPOININ, ED    Imaging Review Ct Head Wo Contrast  11/29/2013   CLINICAL DATA:  80 year old female presenting with acute onset of left-sided weakness and garbled speech. Code stroke.  EXAM: CT HEAD WITHOUT CONTRAST  TECHNIQUE: Contiguous axial images were obtained from the base of the skull through the vertex without intravenous contrast.  COMPARISON:  No priors.  FINDINGS: There is loss of gray-white differentiation in the posterior right frontal and anterior right parietal lobe, concerning for acute or early subacute ischemia. Additionally, there is loss of gray-white differentiation in the region of the right insular ribbon. No signs of acute intracranial hemorrhage. No focal mass, mass effect, hydrocephalus or abnormal intra or extra-axial fluid collections. Mild cerebral atrophy. Patchy and confluent areas of decreased attenuation are noted throughout the deep and periventricular white matter of the cerebral hemispheres bilaterally, compatible with chronic microvascular ischemic disease. Visualized paranasal sinuses and mastoids are well pneumatized, with exception of some mild multifocal mucosal thickening in the ethmoid sinuses bilaterally. No acute displaced skull fractures.  IMPRESSION: 1. Findings, as above, concerning for acute or early subacute ischemia in the right MCA distribution. 2. Mild cerebral atrophy with chronic microvascular ischemic changes in the cerebral white matter. Critical Value/emergent results were called by telephone at the time of interpretation on 11/29/2013 at 10:36 am to Dr. Leroy Kennedy, who verbally acknowledged these results.   Electronically  Signed   By: Trudie Reed M.D.   On: 11/29/2013 10:39     EKG Interpretation   Date/Time:  Sunday November 29 2013 10:50:25 EDT Ventricular Rate:  66 PR Interval:  209 QRS Duration: 104 QT Interval:  423 QTC Calculation: 443 R Axis:   20 Text Interpretation:  Sinus rhythm Left ventricular hypertrophy Anterior  infarct, old Since last tracing rate slower Confirmed by Ileta Ofarrell  MD, Amandamarie Feggins  [phone]) on 11/29/2013 11:01:05 AM      MDM   Final diagnoses:  Stroke  Hyponatremia  Anemia, unspecified anemia type  Acute ischemic stroke    The patient has apparently had an acute stroke, she will need to go to CT to evaluate her brain, labs, EKG, neurology is at the bedside at this time.  Evidently the patient has a history of hyponatremia as well as acute encephalopathy, she also has hypertension, dementia, hyperlipidemia and acid reflux. Her CODE STATUS is "DO NOT RESUSCITATE". According to the neurologist at this time the patient does not qualify for thrombolytic therapy.  The patient has had an acute ischemic stroke, neurology consult immediately and at the bedside agrees, the patient is not a thrombolytic candidate according to Dr. Leroy Kennedy - will be admitted to the internal medicine teaching service for further evaluation and workup along with neuro imaging, evaluation and observation.   The patient is critically ill with an acute ischemic stroke, persistent abnormal neurologic exam and level of alertness.  CRITICAL CARE Performed by: Vida Roller Total critical care time: 35 Critical care time was exclusive of separately billable procedures and treating other patients. Critical care was necessary to treat or prevent imminent or life-threatening deterioration. Critical care was time spent personally by me on the following activities: development of treatment plan with patient and/or surrogate as well as nursing, discussions with consultants, evaluation of patient's response to  treatment, examination of patient, obtaining history from patient or surrogate, ordering and performing treatments and interventions, ordering and review of laboratory studies, ordering and review of radiographic studies, pulse oximetry and re-evaluation of patient's condition.   Vida Roller, MD 11/29/13 [phone]

## 2013-11-29 NOTE — Progress Notes (Signed)
1340 - report recd from Marylene LandAngela, RN in ED. Will monitor for pt's arrival to unit.   Andrew AuVafiadis, Shona Pardo I 11/29/2013 2:07 PM

## 2013-11-29 NOTE — ED Notes (Signed)
Patient transported to MRI 

## 2013-11-29 NOTE — Progress Notes (Signed)
Unable to perform in/out cath. Pt agitated. Will attempt when pt is less agitated.  Andrew AuVafiadis, Kaysie Michelini I 11/29/2013 3:14 PM

## 2013-11-29 NOTE — Progress Notes (Addendum)
1358 - pt arrived to unit from ED per stretcher by Charlott RakesAida, nurse tech. No distress noted.  Assessment performed. Unable to perform NIH stroke scale due to pt being agitated, uncooperative, confused.  Will closely monitor   Andrew AuVafiadis, Kalley Nicholl I 11/29/2013 2:10 PM

## 2013-11-29 NOTE — Consult Note (Addendum)
Referring Physician: ED    Chief Complaint: left hemiparesis, dysarthria, right gaze preference.  HPI:                                                                                                                                         Norval GableLois C Grieb is an 80 y.o. female with a past medical history significant for HTN, hyperlipidemia, CAD, dementia, depression, cerebral bleeding 4 months ago, brought in by ambulance as a code stroke due to acute onset of the above stated symptoms. She is a resident at [facility] in [place] and reportedly was normal at 7:45 am , had breakfast and after returning to her room around 9 am was noted to have left sided weakness and incomprehensible speech. EMS was summoned and noted that she was not following commands, was not moving the left side and had a right gaze preference. Initial NIHSS 16. CT brain demonstrated a loss of gray-white differentiation in the posterior right frontal and anterior right parietal lobe, concerning for acute or early subacute ischemia. Additionally, there is loss of gray-white  differentiation in the region of the right insular ribbon. No mass effect. Currently, she is alert an awake but does not follow commands. As per nursing home staff, patient has limited ability to function. Date last known well: 11/29/13 Time last known well: 745 am tPA Given: no, hypodensity >1/3 right MCA distribution and recent brain hemorrage NIHSS: 16   Past Medical History  Diagnosis Date  . Hypertension   . Acute encephalopathy   . Dementia   . Hyperlipemia   . Hyponatremia   . CAD (coronary artery disease)   . UTI (lower urinary tract infection)   . Depressive disorder   . Osteoporosis   . Asthma   . Muscle weakness     History reviewed. No pertinent past surgical history.  History reviewed. No pertinent family history. Social History:  reports that she does not drink alcohol or use illicit drugs. Her tobacco history is not on  file.  Allergies:  Allergies  Allergen Reactions  . Ciprofloxacin   . Doxycycline   . Lorazepam   . Paroxetine   . Sulfa Antibiotics   . Trazodone And Nefazodone     Medications:  I have reviewed the patient's current medications.  ROS: unable to obtain due to mental status.                                                                                                         History obtained from chart review  Physical exam: no apparent distress. BP 130/80 p 82 R 17, SpO2 100.00%. Head: normocephalic. Neck: supple, no bruits, no JVD. Cardiac: no murmurs. Lungs: clear. Abdomen: soft, no tender, no mass. Extremities: no edema. Neurologic Examination:                                                                                                      General: Mental Status: Alert and awake but doesn't follow commands. Seems to be dysarthric. Cranial Nerves: II: Discs flat bilaterally; Visual fields grossly normal, pupils equal, round, reactive to light and accommodation III,IV, VI: ptosis not present, eyes don't not cross the midline V,VII: smile asymmetric due to subtle left face weakness, facial light touch sensation normal bilaterally VIII: hearing normal bilaterally IX,X: gag reflex present XI: bilateral shoulder shrug no tested XII: midline tongue extension without atrophy or fasciculations Motor: Left hemiparesis Sensory: Pinprick and light touch impaired in he left Deep Tendon Reflexes:  1 all over Plantars: Right: downgoing   Left: downgoing Cerebellar: Unable to test as patient is not following commands. Gait: Unable to test.    Results for orders placed during the hospital encounter of 11/29/13 (from the past 48 hour(s))  CBC     Status: Abnormal   Collection Time    11/29/13 10:21 AM      Result Value Ref Range   WBC 7.0  4.0 -  10.5 K/uL   RBC 3.11 (*) 3.87 - 5.11 MIL/uL   Hemoglobin 9.8 (*) 12.0 - 15.0 g/dL   HCT 96.0 (*) 45.4 - 09.8 %   MCV 93.9  78.0 - 100.0 fL   MCH 31.5  26.0 - 34.0 pg   MCHC 33.6  30.0 - 36.0 g/dL   RDW 11.9  14.7 - 82.9 %   Platelets 312  150 - 400 K/uL  DIFFERENTIAL     Status: Abnormal   Collection Time    11/29/13 10:21 AM      Result Value Ref Range   Neutrophils Relative % 85 (*) 43 - 77 %   Neutro Abs 5.9  1.7 - 7.7 K/uL   Lymphocytes Relative 7 (*) 12 - 46 %   Lymphs Abs 0.5 (*) 0.7 - 4.0 K/uL   Monocytes Relative 8  3 - 12 %   Monocytes Absolute 0.6  0.1 - 1.0 K/uL  Eosinophils Relative 0  0 - 5 %   Eosinophils Absolute 0.0  0.0 - 0.7 K/uL   Basophils Relative 0  0 - 1 %   Basophils Absolute 0.0  0.0 - 0.1 K/uL  I-STAT TROPOININ, ED     Status: None   Collection Time    11/29/13 10:27 AM      Result Value Ref Range   Troponin i, poc 0.00  0.00 - 0.08 ng/mL   Comment 3            Comment: Due to the release kinetics of cTnI,     a negative result within the first hours     of the onset of symptoms does not rule out     myocardial infarction with certainty.     If myocardial infarction is still suspected,     repeat the test at appropriate intervals.  I-STAT CHEM 8, ED     Status: Abnormal   Collection Time    11/29/13 10:28 AM      Result Value Ref Range   Sodium 127 (*) 137 - 147 mEq/L   Potassium 4.0  3.7 - 5.3 mEq/L   Chloride 89 (*) 96 - 112 mEq/L   BUN 16  6 - 23 mg/dL   Creatinine, Ser 1.61  0.50 - 1.10 mg/dL   Glucose, Bld 096 (*) 70 - 99 mg/dL   Calcium, Ion 0.45  4.09 - 1.30 mmol/L   TCO2 27  0 - 100 mmol/L   Hemoglobin 11.2 (*) 12.0 - 15.0 g/dL   HCT 81.1 (*) 91.4 - 78.2 %   Ct Head Wo Contrast  11/29/2013   CLINICAL DATA:  80 year old female presenting with acute onset of left-sided weakness and garbled speech. Code stroke.  EXAM: CT HEAD WITHOUT CONTRAST  TECHNIQUE: Contiguous axial images were obtained from the base of the skull through the  vertex without intravenous contrast.  COMPARISON:  No priors.  FINDINGS: There is loss of gray-white differentiation in the posterior right frontal and anterior right parietal lobe, concerning for acute or early subacute ischemia. Additionally, there is loss of gray-white differentiation in the region of the right insular ribbon. No signs of acute intracranial hemorrhage. No focal mass, mass effect, hydrocephalus or abnormal intra or extra-axial fluid collections. Mild cerebral atrophy. Patchy and confluent areas of decreased attenuation are noted throughout the deep and periventricular white matter of the cerebral hemispheres bilaterally, compatible with chronic microvascular ischemic disease. Visualized paranasal sinuses and mastoids are well pneumatized, with exception of some mild multifocal mucosal thickening in the ethmoid sinuses bilaterally. No acute displaced skull fractures.  IMPRESSION: 1. Findings, as above, concerning for acute or early subacute ischemia in the right MCA distribution. 2. Mild cerebral atrophy with chronic microvascular ischemic changes in the cerebral white matter. Critical Value/emergent results were called by telephone at the time of interpretation on 11/29/2013 at 10:36 am to Dr. Leroy Kennedy, who verbally acknowledged these results.   Electronically Signed   By: Trudie Reed M.D.   On: 11/29/2013 10:39    Assessment: 80 y.o. female with a right hemispheric syndrome and early signs of ischemia involving >1/3 right MCA distribution, recent cerebral bleeding  and thus not a candidate for IV thrombolysis  . Despite a high NIHSS, I did not consider the patient suitable for endovascular intervention due to dementia with poor capacity to function at baseline. Will admit to medicine. Complete stroke work up. Aspirin. Stroke team will follow up in the morning.  Stroke Risk Factors - age, HTN, hyperlipidemia, CAD  Plan: 1. HgbA1c, fasting lipid panel 2. MRI, MRA  of the brain  without contrast 3. Echocardiogram 4. Carotid dopplers 5. Prophylactic therapy-aspirin after passing swallowing evaluation 6. Risk factor modification 7. Telemetry monitoring 8. Frequent neuro checks 9. PT/OT SLP   Wyatt Portelasvaldo Camilo ,MD [facility] [phone]  11/29/2013, 10:45 AM

## 2013-11-30 ENCOUNTER — Inpatient Hospital Stay (HOSPITAL_COMMUNITY): Payer: Medicare Other

## 2013-11-30 DIAGNOSIS — R9430 Abnormal result of cardiovascular function study, unspecified: Secondary | ICD-10-CM

## 2013-11-30 DIAGNOSIS — R0600 Dyspnea, unspecified: Secondary | ICD-10-CM

## 2013-11-30 DIAGNOSIS — I429 Cardiomyopathy, unspecified: Secondary | ICD-10-CM

## 2013-11-30 DIAGNOSIS — R000 Tachycardia, unspecified: Secondary | ICD-10-CM

## 2013-11-30 DIAGNOSIS — F329 Major depressive disorder, single episode, unspecified: Secondary | ICD-10-CM

## 2013-11-30 DIAGNOSIS — I359 Nonrheumatic aortic valve disorder, unspecified: Secondary | ICD-10-CM

## 2013-11-30 DIAGNOSIS — I639 Cerebral infarction, unspecified: Secondary | ICD-10-CM

## 2013-11-30 DIAGNOSIS — R0602 Shortness of breath: Secondary | ICD-10-CM

## 2013-11-30 DIAGNOSIS — I10 Essential (primary) hypertension: Secondary | ICD-10-CM

## 2013-11-30 DIAGNOSIS — E871 Hypo-osmolality and hyponatremia: Secondary | ICD-10-CM

## 2013-11-30 DIAGNOSIS — K117 Disturbances of salivary secretion: Secondary | ICD-10-CM

## 2013-11-30 DIAGNOSIS — Z515 Encounter for palliative care: Secondary | ICD-10-CM

## 2013-11-30 DIAGNOSIS — Z66 Do not resuscitate: Secondary | ICD-10-CM

## 2013-11-30 DIAGNOSIS — F0390 Unspecified dementia without behavioral disturbance: Secondary | ICD-10-CM

## 2013-11-30 DIAGNOSIS — I6340 Cerebral infarction due to embolism of unspecified cerebral artery: Secondary | ICD-10-CM

## 2013-11-30 DIAGNOSIS — I6350 Cerebral infarction due to unspecified occlusion or stenosis of unspecified cerebral artery: Secondary | ICD-10-CM

## 2013-11-30 DIAGNOSIS — R531 Weakness: Secondary | ICD-10-CM

## 2013-11-30 LAB — COMPREHENSIVE METABOLIC PANEL
ALT: 11 U/L (ref 0–35)
AST: 42 U/L — ABNORMAL HIGH (ref 0–37)
Albumin: 3.2 g/dL — ABNORMAL LOW (ref 3.5–5.2)
Alkaline Phosphatase: 59 U/L (ref 39–117)
Anion gap: 13 (ref 5–15)
BILIRUBIN TOTAL: 0.5 mg/dL (ref 0.3–1.2)
BUN: 19 mg/dL (ref 6–23)
CHLORIDE: 88 meq/L — AB (ref 96–112)
CO2: 25 meq/L (ref 19–32)
CREATININE: 0.53 mg/dL (ref 0.50–1.10)
Calcium: 9.2 mg/dL (ref 8.4–10.5)
GFR calc Af Amer: >90 mL/min (ref >90)
GFR, EST NON AFRICAN AMERICAN: 82 mL/min — AB (ref >90)
Glucose, Bld: 129 mg/dL — ABNORMAL HIGH (ref 70–99)
Potassium: 4.1 mEq/L (ref 3.7–5.3)
SODIUM: 126 meq/L — AB (ref 137–147)
Total Protein: 6.7 g/dL (ref 6.0–8.3)

## 2013-11-30 LAB — LIPID PANEL
Cholesterol: 113 mg/dL (ref 0–200)
HDL: 52 mg/dL (ref >39)
LDL Cholesterol: 46 mg/dL (ref 0–99)
Total CHOL/HDL Ratio: 2.2 RATIO
Triglycerides: 76 mg/dL (ref <150)
VLDL: 15 mg/dL (ref 0–40)

## 2013-11-30 LAB — CBC
HCT: 30.6 % — ABNORMAL LOW (ref 36.0–46.0)
Hemoglobin: 10.3 g/dL — ABNORMAL LOW (ref 12.0–15.0)
MCH: 31 pg (ref 26.0–34.0)
MCHC: 33.7 g/dL (ref 30.0–36.0)
MCV: 92.2 fL (ref 78.0–100.0)
PLATELETS: 373 10*3/uL (ref 150–400)
RBC: 3.32 MIL/uL — ABNORMAL LOW (ref 3.87–5.11)
RDW: 14.7 % (ref 11.5–15.5)
WBC: 12.7 10*3/uL — ABNORMAL HIGH (ref 4.0–10.5)

## 2013-11-30 LAB — HEMOGLOBIN A1C
Hgb A1c MFr Bld: 4.5 % (ref <5.7)
Mean Plasma Glucose: 82 mg/dL (ref <117)

## 2013-11-30 MED ORDER — SCOPOLAMINE 1 MG/3DAYS TD PT72
1.0000 | MEDICATED_PATCH | TRANSDERMAL | Status: DC
  Administered 2013-11-30: 1.5 mg via TRANSDERMAL
  Filled 2013-11-30: qty 1

## 2013-11-30 MED ORDER — METOPROLOL TARTRATE 1 MG/ML IV SOLN
2.5000 mg | Freq: Once | INTRAVENOUS | Status: AC
  Administered 2013-11-30: 2.5 mg via INTRAVENOUS
  Filled 2013-11-30: qty 5

## 2013-11-30 MED ORDER — MORPHINE SULFATE 2 MG/ML IJ SOLN
1.0000 mg | INTRAMUSCULAR | Status: DC | PRN
  Administered 2013-11-30 – 2013-12-02 (×10): 1 mg via INTRAVENOUS
  Filled 2013-11-30 (×10): qty 1

## 2013-11-30 MED ORDER — LORAZEPAM 2 MG/ML IJ SOLN: 1.0000 mg | INTRAMUSCULAR | Status: DC | PRN

## 2013-11-30 NOTE — Progress Notes (Addendum)
Subjective: Overnight, patient was tachycardic in 130s-140s, BP in 120s. She was given a small bolus without much improvement. Her HR sustained in the 130s and was given Metoprolol 2.5 mg IV. HR continues to be in 120s.   Patient awake this morning. Unable to verbalize or follow commands. She was having difficulty with secretions, making guttural noises. Will consult palliative care for goals of care. Spoke with sister-in-law and they are agreeable to palliative care consult.   Objective: Vital signs in last 24 hours: Filed Vitals:   11/30/13 0412 11/30/13 0420 11/30/13 0600 11/30/13 0934  BP: 125/73 116/62 119/78 133/58  Pulse: 121 117 122 123  Temp: 98 F (36.7 C) 98 F (36.7 C) 98 F (36.7 C) 98.6 F (37 C)  TempSrc: Axillary Axillary Axillary Axillary  Resp: 22 24 22 22   Weight:      SpO2: 99% 98% 98% 97%   Weight change:   Intake/Output Summary (Last 24 hours) at 11/30/13 1230 Last data filed at 11/30/13 1031  Gross per 24 hour  Intake      0 ml  Output      0 ml  Net      0 ml   Physical Exam General: awake, sitting up in bed, breaths labored, difficulty with secretions  HEENT: [place]/AT, EOMI, mucus membranes moist CV: tachycardic, normal S1/S2, no m/g/r Pulm: rhonchorous breath sounds, breaths mildly labored on 2 L oxygen via [place] Abd: BS+, soft, non-tender Ext: warm, no edema Neuro: awake, unable to follow commands, unable to assess strength  Lab Results: Basic Metabolic Panel:  Recent Labs Lab 11/29/13 1021 11/29/13 1028 11/30/13 0530  NA 129* 127* 126*  K 4.2 4.0 4.1  CL 90* 89* 88*  CO2 28  --  25  GLUCOSE 145* 148* 129*  BUN 16 16 19   CREATININE 0.54 0.70 0.53  CALCIUM 9.4  --  9.2   Liver Function Tests:  Recent Labs Lab 11/29/13 1021 11/30/13 0530  AST 15 42*  ALT 8 11  ALKPHOS 62 59  BILITOT <0.2* 0.5  PROT 6.7 6.7  ALBUMIN 3.2* 3.2*   No results found for this basename: LIPASE, AMYLASE,  in the last 168 hours No results found for  this basename: AMMONIA,  in the last 168 hours CBC:  Recent Labs Lab 11/29/13 1021 11/29/13 1028 11/30/13 0530  WBC 7.0  --  12.7*  NEUTROABS 5.9  --   --   HGB 9.8* 11.2* 10.3*  HCT 29.2* 33.0* 30.6*  MCV 93.9  --  92.2  PLT 312  --  373   Cardiac Enzymes: No results found for this basename: CKTOTAL, CKMB, CKMBINDEX, TROPONINI,  in the last 168 hours BNP: No results found for this basename: PROBNP,  in the last 168 hours D-Dimer: No results found for this basename: DDIMER,  in the last 168 hours CBG:  Recent Labs Lab 11/29/13 1021  GLUCAP 175*   Hemoglobin A1C:  Recent Labs Lab 11/29/13 1021  HGBA1C 4.5   Fasting Lipid Panel:  Recent Labs Lab 11/30/13 0530  CHOL 113  HDL 52  LDLCALC 46  TRIG 76  CHOLHDL 2.2   Thyroid Function Tests: No results found for this basename: TSH, T4TOTAL, FREET4, T3FREE, THYROIDAB,  in the last 168 hours Coagulation:  Recent Labs Lab 11/29/13 1021  LABPROT 13.5  INR 1.02   Anemia Panel: No results found for this basename: VITAMINB12, FOLATE, FERRITIN, TIBC, IRON, RETICCTPCT,  in the last 168 hours Urine Drug Screen:  Drugs of Abuse     Component Value Date/Time   LABOPIA NONE DETECTED 11/29/2013 1555   COCAINSCRNUR NONE DETECTED 11/29/2013 1555   LABBENZ NONE DETECTED 11/29/2013 1555   AMPHETMU NONE DETECTED 11/29/2013 1555   THCU NONE DETECTED 11/29/2013 1555   LABBARB NONE DETECTED 11/29/2013 1555    Alcohol Level:  Recent Labs Lab 11/29/13 1021  ETH <11   Urinalysis:  Recent Labs Lab 11/29/13 1555  COLORURINE YELLOW  LABSPEC 1.009  PHURINE 8.5*  GLUCOSEU NEGATIVE  HGBUR NEGATIVE  BILIRUBINUR NEGATIVE  KETONESUR NEGATIVE  PROTEINUR NEGATIVE  UROBILINOGEN 0.2  NITRITE NEGATIVE  LEUKOCYTESUR NEGATIVE    Micro Results: No results found for this or any previous visit (from the past 240 hour(s)). Studies/Results: Dg Chest 2 View  11/30/2013   CLINICAL DATA:  Shortness of breath.  Congestion.   EXAM: CHEST  2 VIEW  COMPARISON:  11/29/2013.  FINDINGS: Mediastinum and hilar structures normal. Heart size stable. No pulmonary venous congestion. No pleural effusion or pneumothorax. Right base subsegmental atelectasis noted. No acute bony abnormality.  IMPRESSION: Right base subsegmental atelectasis.   Electronically Signed   By: Maisie Fushomas  Register   On: 11/30/2013 10:23   Ct Head Wo Contrast  11/29/2013   CLINICAL DATA:  80 year old female presenting with acute onset of left-sided weakness and garbled speech. Code stroke.  EXAM: CT HEAD WITHOUT CONTRAST  TECHNIQUE: Contiguous axial images were obtained from the base of the skull through the vertex without intravenous contrast.  COMPARISON:  No priors.  FINDINGS: There is loss of gray-white differentiation in the posterior right frontal and anterior right parietal lobe, concerning for acute or early subacute ischemia. Additionally, there is loss of gray-white differentiation in the region of the right insular ribbon. No signs of acute intracranial hemorrhage. No focal mass, mass effect, hydrocephalus or abnormal intra or extra-axial fluid collections. Mild cerebral atrophy. Patchy and confluent areas of decreased attenuation are noted throughout the deep and periventricular white matter of the cerebral hemispheres bilaterally, compatible with chronic microvascular ischemic disease. Visualized paranasal sinuses and mastoids are well pneumatized, with exception of some mild multifocal mucosal thickening in the ethmoid sinuses bilaterally. No acute displaced skull fractures.  IMPRESSION: 1. Findings, as above, concerning for acute or early subacute ischemia in the right MCA distribution. 2. Mild cerebral atrophy with chronic microvascular ischemic changes in the cerebral white matter. Critical Value/emergent results were called by telephone at the time of interpretation on 11/29/2013 at 10:36 am to Dr. Leroy Kennedyamilo, who verbally acknowledged these results.    Electronically Signed   By: Trudie Reedaniel  Entrikin M.D.   On: 11/29/2013 10:39   Dg Chest Portable 1 View  11/29/2013   CLINICAL DATA:  Stroke. Patient with left-sided weakness and incomprehensible speech. Unable to follow commands.  EXAM: PORTABLE CHEST - 1 VIEW  COMPARISON:  08/12/2009  FINDINGS: Semi-erect portable study. Cardiac silhouette is normal in size. Aorta is mildly uncoiled. No mediastinal or hilar masses. Lungs are clear. No pleural effusion or pneumothorax.  Bony thorax is demineralized but grossly intact.  IMPRESSION: No acute cardiopulmonary disease.   Electronically Signed   By: Amie Portlandavid  Ormond M.D.   On: 11/29/2013 11:59   Medications: I have reviewed the patient's current medications. Scheduled Meds: Continuous Infusions: PRN Meds:.   Assessment/Plan: [name] is a 80yo woman, resident at [facility], w/ PMHx of HTN, HLD, CAD, dementia, depression, hyponatremia, and recent hospitalization for a small left parietal acute subdural hematoma after a fall on 08/15/13  who presented to the ED with altered mental status 2/2 right MCA stroke.   1. Acute Right MCA Stroke: Patient is awake, but unable to verbalize, follow commands. She seems to be having difficulty with secretions. At high risk for aspiration. Will get CXR. Spoke with sister-in-law and are agreeable to palliative care consult to discuss goals of care. Sister-in-law Sonny Masters) can be reached at [phone]. Britta Mccreedy and her husband (patient's brother), Jake Shark, are POAs. HbA1c 4.5. Lipid panel shows Chol 113, Trigly 76, HDL 52, LDL 46.  - Neurology on board, appreciate recommendations  - Palliative care consulted, appreciate help and recommendations - MRI/MRA Head pending - f/u CXR - Carotid doppler pending - 2D Echocardiogram pending - NPO  - Cardiac monitoring  - Neuro checks Q2H  - PT eval  - SLP eval   2. Hyponatremia: Na 129 on admission, now 126. Patient has hx of hyponatremia. At last [facility] hospitalization in  July she was found to have Na of 120 on admission. Corrected with hypertonic saline. Will hold fluids for now.  - Continue to monitor   3. HTN: BP 137/74 on admission. BP has remained stable in 110s-130s systolic. Patient has been tachycardic in 110s-120s. Will continue to monitor. She takes HCTZ 25 mg daily and Metoprolol 50 mg BID at home.  - Hold home meds for now given acute stroke   4. Dementia: Patient lives at ALF. She is normally able to talk and feed herself per family. She gets around by wheelchair. She is not on any home medications for dementia. Will continue to monitor her status.   5. Depression: Patient takes Mirtazepine 7.5 mg QHS at home.  - Hold home meds for now   Diet: NPO, swallow eval  DVT/PE PPx: SCDs  Dispo: Disposition is deferred at this time, awaiting improvement of current medical problems. Anticipated discharge in approximately 1-2 day(s).   The patient does have a current PCP (No Pcp Per Patient) and does need an [facility] hospital follow-up appointment after discharge.  The patient does not have transportation limitations that hinder transportation to clinic appointments.  .Services Needed at time of discharge: Y = Yes, Blank = No PT:   OT:   RN:   Equipment:   Other:     LOS: 1 day   Rich Number, MD 11/30/2013, 12:30 PM

## 2013-11-30 NOTE — H&P (Signed)
Pt seen and examined with Dr. Rivet. I agree with documentation as outlined in her note. Please refer to my progress note from today for further details 

## 2013-11-30 NOTE — Consult Note (Signed)
Patient [name]      DOB: 1922-08-03      EAV:[id number]     Consult Note from the Palliative Medicine Team at [facility]    Consult Requested by: Dr Etta Grandchild     PCP: No PCP Per Patient Reason for Consultation: Clarification of GCO and options    Phone Number:None  Assessment of patients Current state:  Continued physial and functionals and cognitive decline over the past several months.  Work-up per neurology, right hemispheric syndrome and early signs of ischemia involving >1/3 right MCA distribution, recent cerebral bleeding and thus not a candidate for IV thrombolysis . Despite a high NIHSS, I did not consider the patient suitable for endovascular intervention due to dementia with poor capacity to function at baseline.  Family faced with advanced directive decisions and anticipatory care needs   Consult is for review of medical treatment options, clarification of goals of care and end of life issues, disposition and options, and symptom recommendation.  This NP Lorinda Creed reviewed medical records, received report from team, assessed the patient and then meet at the patient's bedside along with her brother [name] # [phone] and his wife [name] to discuss diagnosis prognosis, GOC, EOL wishes disposition and options.  A detailed discussion was had today regarding advanced directives.  Concepts specific to code status, artifical feeding and hydration, continued IV antibiotics and rehospitalization was had.  The difference between a aggressive medical intervention path  and a palliative comfort care path for this patient at this time was had.  Values and goals of care important to patient and family were attempted to be elicited.  Concept of Hospice and Palliative Care were discussed  Natural trajectory and expectations at EOL were discussed.  Questions and concerns addressed.  Hard Choices booklet left for review. Family encouraged to call with questions or concerns.  PMT  will continue to support holistically.   Goals of Care: 1.  Code Status:  DNR/DNI-comfort is main faocus of care   2. Scope of Treatment: 1. Vital Signs:daily  2. Respiratory/Oxygen:for comfort only 3. Nutritional Support/Tube Feeds: no artifical feeding or hydration now or in the future 4. Antibiotics:none 5. Blood Products:none 6. IVF:KVO for meds only 7. Review of Medications to be discontinued: minimize for comfort only 8. Labs: none 9. Telemetry:none  3. Disposition: Shift to comfort, re-evaluate in morning.  Prognosis is likely hrs to days.   4. Symptom Management:   1. Anxiety/Agitation:  Ativan 1 mg IV every 4 hrs prn 2. Pain/Dyspnea: Morphine 1 mg IV every hr prn  3. Terminal Secretions:  Scopolamine patch   5. Psychosocial:  Emotional support offered to family at bedsdie  6. Spiritual: Decline chaplain visit at this time     Patient Documents Completed or Given: Document Given Completed  Advanced Directives Pkt    MOST X   DNR  X  Gone from My Sight    Hard Choices X     Brief HPI: [name] is a 80yo woman, resident at [facility], w/ PMHx of HTN, HLD, CAD, dementia, depression, hyponatremia, and recent hospitalization for a small left parietal acute subdural hematoma after a fall on 08/15/13 who presented to the ED with altered mental status. History was obtained through medical records and family members. Per nursing staff at ALF, patient was noted to be in normal state of health at 7:45 this morning, had breakfast, and then was returning to her room when she was found to have  left-sided weakness and "garbled" speech. EMS was called and brought the patient to the hospital. Per patient's brother and sister-in-law, they saw the patient yesterday at the ALF and she was more tired than usual, but able to speak with them clearly.  In the ED, neurology was consulted. CT Head showed loss of gray-white differentiation in the posterior right frontal and anterior  right parietal lobe, concerning for acute or early subacute ischemia in the MCA distribution. Patient was not a candidate for IV thrombolysis given recent cerebral bleeding.    ROS: unable to illicit due to altered mental status   PMH:  Past Medical History  Diagnosis Date  . Hypertension   . Acute encephalopathy   . Dementia   . Hyperlipemia   . Hyponatremia   . CAD (coronary artery disease)   . UTI (lower urinary tract infection)   . Depressive disorder   . Osteoporosis   . Asthma   . Muscle weakness      WUJ:WJXBJYNPSH:History reviewed. No pertinent past surgical history. I have reviewed the FH and SH and  If appropriate update it with new information. Allergies  Allergen Reactions  . Ciprofloxacin   . Doxycycline   . Lorazepam   . Paroxetine   . Sulfa Antibiotics   . Trazodone And Nefazodone    Scheduled Meds: Continuous Infusions: PRN Meds:.      BP 133/58  Pulse 123  Temp(Src) 98.6 F (37 C) (Axillary)  Resp 22  Wt 43.772 kg (96 lb 8 oz)  SpO2 97%   PPS:20 % at best   Intake/Output Summary (Last 24 hours) at 11/30/13 1249 Last data filed at 11/30/13 1031  Gross per 24 hour  Intake      0 ml  Output      0 ml  Net      0 ml    Physical Exam:  General: minimally responsive,  ill appearing, EOL  HEENT:  Audible throat secretions, suctioned for copious Chest:   decreaed in bases with scattered coarse BS CVS: tachycardic Abdomen: soft NT +BS Ext: without edema Neuro:  Minimally resposnive  Labs: CBC    Component Value Date/Time   WBC 12.7* 11/30/2013 0530   RBC 3.32* 11/30/2013 0530   HGB 10.3* 11/30/2013 0530   HCT 30.6* 11/30/2013 0530   PLT 373 11/30/2013 0530   MCV 92.2 11/30/2013 0530   MCH 31.0 11/30/2013 0530   MCHC 33.7 11/30/2013 0530   RDW 14.7 11/30/2013 0530   LYMPHSABS 0.5* 11/29/2013 1021   MONOABS 0.6 11/29/2013 1021   EOSABS 0.0 11/29/2013 1021   BASOSABS 0.0 11/29/2013 1021    BMET    Component Value Date/Time   NA  126* 11/30/2013 0530   K 4.1 11/30/2013 0530   CL 88* 11/30/2013 0530   CO2 25 11/30/2013 0530   GLUCOSE 129* 11/30/2013 0530   BUN 19 11/30/2013 0530   CREATININE 0.53 11/30/2013 0530   CALCIUM 9.2 11/30/2013 0530   GFRNONAA 82* 11/30/2013 0530   GFRAA >90 11/30/2013 0530    CMP     Component Value Date/Time   NA 126* 11/30/2013 0530   K 4.1 11/30/2013 0530   CL 88* 11/30/2013 0530   CO2 25 11/30/2013 0530   GLUCOSE 129* 11/30/2013 0530   BUN 19 11/30/2013 0530   CREATININE 0.53 11/30/2013 0530   CALCIUM 9.2 11/30/2013 0530   PROT 6.7 11/30/2013 0530   ALBUMIN 3.2* 11/30/2013 0530   AST 42* 11/30/2013 0530   ALT  11 11/30/2013 0530   ALKPHOS 59 11/30/2013 0530   BILITOT 0.5 11/30/2013 0530   GFRNONAA 82* 11/30/2013 0530   GFRAA >90 11/30/2013 0530     Time In Time Out Total Time Spent with Patient Total Overall Time  1400 1515      Greater than 50%  of this time was spent counseling and coordinating care related to the above assessment and plan.   Lorinda CreedMary Emilya Justen NP  Palliative Medicine Team Team Phone # [phone] Pager [phone]  Discussed with Dr  Blair Promiseivett

## 2013-11-30 NOTE — Progress Notes (Signed)
SLP Cancellation Note  Patient Details Name: [name] MRN: [id number] DOB: 01/26/1923   Cancelled treatment:       Reason Eval/Treat Not Completed: Other (comment) (Patient leaving for procedure/MRI).  SLP will follow up as able.  Fae PippinMelissa Lateya Dauria, M.A., CCC-SLP [phone]  Michell Giuliano 11/30/2013, 3:57 PM

## 2013-11-30 NOTE — Progress Notes (Signed)
*  PRELIMINARY RESULTS* Vascular Ultrasound Carotid Duplex (Doppler) has been completed.   Study was technically limited due to poor patient cooperation and erratic breathing. Limited evaluation of the right internal carotid artery revealed 1-39% stenosis. Unable to adequately evaluate the left internal carotid artery due to technical limitations. Unable to visualize bilateral vertebral arteries.  Patient encounter, including patients breathing, was discussed with Amil AmenJulia, RN.  11/30/2013 10:46 AM Gertie FeyMichelle Saahir Prude, RVT, RDCS, RDMS

## 2013-11-30 NOTE — Progress Notes (Signed)
Patients heart rate up to the 130's MD notified. Patient continues to exhibit bilat lung congestion and restlessness.  Suctioned prn with difficulty as patient climps down on the suction catheter.

## 2013-11-30 NOTE — Progress Notes (Signed)
  Echocardiogram 2D Echocardiogram has been performed.  Giovanni Bath FRANCES 11/30/2013, 10:54 AM 

## 2013-11-30 NOTE — Progress Notes (Signed)
STROKE TEAM PROGRESS NOTE   HISTORY [name] is an 80 y.o. female with a past medical history significant for HTN, hyperlipidemia, CAD, dementia, depression, cerebral bleeding 4 months ago, brought in by ambulance as a code stroke due to acute onset of left hemiparesis, dysarthria, right gaze preference.  She is a resident at [facility] in [place] and reportedly was normal at 7:45 am 11/29/2013 , had breakfast and after returning to her room around 9 am was noted to have left sided weakness and incomprehensible speech. EMS was summoned and noted that she was not following commands, was not moving the left side and had a right gaze preference.  Initial NIHSS 16.  CT brain demonstrated a loss of gray-white differentiation in the posterior right frontal and anterior right parietal lobe, concerning for acute or early subacute ischemia. Additionally, there is loss of gray-white  differentiation in the region of the right insular ribbon. No mass effect. As per nursing home staff, patient has limited ability to function. Patient was not administered TPA secondary to  hypodensity >1/3 right MCA distribution and recent brain hemorrage . She was admitted for further evaluation and treatment.   SUBJECTIVE (INTERVAL HISTORY) Her brother and sister-in law [name](Keith and Sonny MastersBarbara Clark) is at the bedside.  She was admitted from an AL since stroke in the past. Overall she feels her condition is rapidly worsening. Family at bedside are aware. States she would not want aggressive care in order to survive in a debilitated state. Had MRI confirmed the stroke and Dr. Roda ShuttersXu talked with brother and sister-in-law over the phone that they have decided for comfort care. And the ordered has been placed.  OBJECTIVE Temp:  [97 F (36.1 C)-98.6 F (37 C)] 98.6 F (37 C) (10/26 0934) Pulse Rate:  [64-130] 123 (10/26 0934) Cardiac Rhythm:  [-] Sinus tachycardia (10/26 1050) Resp:  [22-29] 22 (10/26 0934) BP: (116-190)/(57-96) 133/58  mmHg (10/26 0934) SpO2:  [97 %-100 %] 97 % (10/26 0934) Weight:  [43.772 kg (96 lb 8 oz)] 43.772 kg (96 lb 8 oz) (10/25 1410)   Recent Labs Lab 11/29/13 1021  GLUCAP 175*    Recent Labs Lab 11/29/13 1021 11/29/13 1028 11/30/13 0530  NA 129* 127* 126*  K 4.2 4.0 4.1  CL 90* 89* 88*  CO2 28  --  25  GLUCOSE 145* 148* 129*  BUN 16 16 19   CREATININE 0.54 0.70 0.53  CALCIUM 9.4  --  9.2    Recent Labs Lab 11/29/13 1021 11/30/13 0530  AST 15 42*  ALT 8 11  ALKPHOS 62 59  BILITOT <0.2* 0.5  PROT 6.7 6.7  ALBUMIN 3.2* 3.2*    Recent Labs Lab 11/29/13 1021 11/29/13 1028 11/30/13 0530  WBC 7.0  --  12.7*  NEUTROABS 5.9  --   --   HGB 9.8* 11.2* 10.3*  HCT 29.2* 33.0* 30.6*  MCV 93.9  --  92.2  PLT 312  --  373   No results found for this basename: CKTOTAL, CKMB, CKMBINDEX, TROPONINI,  in the last 168 hours  Recent Labs  11/29/13 1021  LABPROT 13.5  INR 1.02    Recent Labs  11/29/13 1555  COLORURINE YELLOW  LABSPEC 1.009  PHURINE 8.5*  GLUCOSEU NEGATIVE  HGBUR NEGATIVE  BILIRUBINUR NEGATIVE  KETONESUR NEGATIVE  PROTEINUR NEGATIVE  UROBILINOGEN 0.2  NITRITE NEGATIVE  LEUKOCYTESUR NEGATIVE       Component Value Date/Time   CHOL 113 11/30/2013 0530   TRIG 76 11/30/2013  0530   HDL 52 11/30/2013 0530   CHOLHDL 2.2 11/30/2013 0530   VLDL 15 11/30/2013 0530   LDLCALC 46 11/30/2013 0530   Lab Results  Component Value Date   HGBA1C 4.5 11/29/2013      Component Value Date/Time   LABOPIA NONE DETECTED 11/29/2013 1555   COCAINSCRNUR NONE DETECTED 11/29/2013 1555   LABBENZ NONE DETECTED 11/29/2013 1555   AMPHETMU NONE DETECTED 11/29/2013 1555   THCU NONE DETECTED 11/29/2013 1555   LABBARB NONE DETECTED 11/29/2013 1555     Recent Labs Lab 11/29/13 1021  ETH <11   I have personally reviewed the radiological images below and agree with the radiology interpretations.  Ct Head Wo Contrast 11/29/2013   1. Findings, as above, concerning for  acute or early subacute ischemia in the right MCA distribution. 2. Mild cerebral atrophy with chronic microvascular ischemic changes in the cerebral white matter.   Dg Chest Portable 1 View 11/29/2013   No acute cardiopulmonary disease.     Carotid Doppler  Limited evaluation of the right internal carotid artery revealed 1-39% stenosis. Unable to adequately evaluate the left internal carotid artery due to technical limitations. Unable to visualize bilateral vertebral arteries.   MRI HEAD  Moderate to large size acute nonhemorrhagic infarct right frontal operculum region, extending posterior aspect of the right operculum and into the right periatrial region, also involving the right subinsular region and small portion of the superior right temporal operculum region. Mild local mass effect without compression of the lateral ventricle.  Global atrophy without hydrocephalus.  Cervical spondylotic changes with kyphosis centered at the C3-4 level. Anterior slippage of C3. Slight flattening of the cord at the C3-4 level. Findings incompletely assessed on present exam. Opacification mastoid air cells greater on the left.   MRA HEAD  Exam is motion degraded. Small aneurysmal bulge right internal carotid artery distal petrous  segment. Ectatic cavernous segment right internal carotid artery with mild narrowing. Mild narrowing and irregularity left internal carotid artery  cavernous segment. No high-grade stenosis of the M1 segment of the middle cerebral artery on either side. Decrease number of visualized right middle cerebral artery branches consistent with patient's acute infarct. The branches which are visualized appear narrowed and irregular bilaterally. Moderate narrowing A1 segment more notable on the left.  Right vertebral artery is not visualized throughout its entirety. The portions which are visualized appear significantly narrowed and irregular.  Poorly delineated right posterior inferior  cerebellar artery. Moderate narrowing proximal left posterior inferior cerebellar  artery. Mild to moderate narrowing proximal basilar artery. Mild narrowing distal basilar artery.  Mild to moderate narrowing superior cerebellar artery bilaterally. Mild to slightly moderate narrowing portions of the posterior cerebral artery bilaterally.  2D echo  - Anteroseptal, apical and distal inferior akinesis; overall severely reduced LV function 20-25% EF; mild AI; cannot R/O apical thrombus; suggest fu study with contrast to exclude.   PHYSICAL EXAM  Temp:  [97 F (36.1 C)-98.6 F (37 C)] 98.6 F (37 C) (10/26 0934) Pulse Rate:  [111-130] 123 (10/26 0934) Resp:  [22-24] 22 (10/26 0934) BP: (116-190)/(58-95) 133/58 mmHg (10/26 0934) SpO2:  [97 %-99 %] 97 % (10/26 0934)  General - Well nourished, well developed, in acute respiratory distress.  Ophthalmologic - not cooperative on exam.  Cardiovascular - Regular rate and rhythm with no murmur.  Neuro - very lethargic and gasping for air, not able to follow any commands, open eyes briefly and not having any speech. PERRL, mild left facial droop, tongue  midline, not able to move LUE, withdraw to pain on LLE 2/5, RUE 3/5 and RLE 3/5 on pain stimulation. Left babinski, reflex decreased throughout. Gait not tested.  ASSESSMENT/PLAN Ms. Chloe GableLois C Teachey is a 80 y.o. female with history of HTN, hyperlipidemia, CAD, dementia, depression, SDH 4 months ago presenting with left hemiparesis, dysarthria, right gaze preference. She did not receive IV t-PA due to hypodensity >1/3 right MCA distribution and recent brain hemorrage.   Stroke:  Non-dominant large right MCA infarct embolic - likely due to low EF and LV thrombus not able to rule out     MRI  Confirmed right MCA moderate sized infarct   MRA  Decreased visualization of right MCA branches  Carotid Doppler  R unremarkable, unable to visualize Left  2D Echo  Low EF at 20-25% and LV thrombus not able to  rule out   LDL 46  HgbA1c 4.5  SCDs for VTE prophylaxis  NPO   Bedrest  DNR  no antithrombotics d/t hx hemorrhage prior to admission, now on no antithrombotics given hx hemorrhage  Risk factor education to family  Ongoing aggressive risk factor management  Resultant left hemiplegia, dysphagia, Cheyne-stokes respirations with periods of apnea, stuporous state  Therapy recommendations:  pending  Disposition:  Comfort care  Dysphagia  From prior stroke  Developed aspiration PNA in the past  Family opted for feeding any\way, aware of risk or PNA recurrence  Hypertension  Stable  Permissive hypertension (OK if < 220/120) but gradually normalize in 5-7 days  Other Stroke Risk Factors Advanced age   [facility] mo [facility]   Coronary artery disease  Other Active Problems  Baseline dementia  Discussed with brother and sister-in-law over the phone that they have decided for comfort care. And the ordered has been placed.   Hospital day # 1  Annie MainSHARON BIBY, MSN, RN, ANVP-BC, ANP-BC, Lawernce IonGNP-BC [facility] Pager: [phone] 11/30/2013 11:33 AM  I, the attending vascular neurologist, have personally obtained a history, examined the patient, evaluated laboratory data, individually viewed imaging studies, and formulated the assessment and plan of care.  I have made any additions or clarifications directly to the above note and agree with the findings and plan as currently documented.   Neurology will sign off. Please call with questions. Thanks for the consult.  Marvel PlanJindong Dnaiel Voller, MD PhD Stroke Neurology 11/30/2013 6:36 PM    To contact Stroke Continuity provider, please refer to [email or web address]. After hours, contact General Neurology

## 2013-11-30 NOTE — Progress Notes (Signed)
Pt seen and examined with Dr. Beckie Saltsivet.   In brief, 80 y/o female with PMH of HTN, HL, CAD. Dementia, depression, hyponatremia, recent hospitalization of small SDH after a fall on 08/15/13 who p/w AMS*1 day. History obtained from chart as pt unable to communicate. Pt was found to have left sided weakness and "garbled" speech. EMS brought patient to ED for further eval. Unable to obtain ROS from patient  Exam: Gen: awake, alert, does not follow commands, did not respond to questions CVS: RRR, normal heart sounds Pulm: Likely transmitted upper airway sounds b/l (gurgling) Abd: soft, non tender, BS + Ext: no pedal edema Neuro: unable to assess strength or sensation as pt unable to cooperate but pt noted to move R limbs but not left  Assessment and Plan:  80 y/o female with AMS likely secondary to CVA  Acute CVA: - CT head with possible R MCA CVA - Will f/u MRI, MRA brain - f/u 2 D ECHO - would starts asa after swallow evaluation if pt tolerating PO - monitor on tele. Neuro following - PT eval  Hyponatremia: - Previous admission pt was thought to have SIADH - Would check serum osm, urine osm and urine lytes - Consider fluid restriction and salt tabs once pt able to take PO  HTN: - meds on hold given likely acute CVA - allow for permissive HTN - Will monitor  Tachycardia: - check repeat EKG today - Possibly secondary to volume depletion  Will obtain palliative consult to discuss options with family

## 2013-11-30 NOTE — Progress Notes (Signed)
CARE MANAGEMENT NOTE 11/30/2013  Patient:  [name],[name]   Account Number:  [id number]  Date Initiated:  11/30/2013  Documentation initiated by:  Chloe CrockerHANDLER,[name]  Subjective/Objective Assessment:   ADMITTED WITH CVA     Action/Plan:   CM FOLLOWING FOR DCP   Anticipated DC Date:  12/03/2013   Anticipated DC Plan:  SKILLED NURSING FACILITY  In-house referral  Clinical Social Worker      DC Planning Services  CM consult      Discharge Disposition:  SNF  Per UR Regulation:  Reviewed for med. necessity/level of care/duration of stay  Comments:  10/26/2015Abelino Gonzalez- B Chloe Bowland RN,BSN,MHA [phone]

## 2013-11-30 NOTE — Progress Notes (Signed)
PT Cancellation Note  Patient Details Name: [name] MRN: [id number] DOB: 08/22/1922   Cancelled Treatment:    Reason Eval/Treat Not Completed: Patient at procedure or test/unavailable  Pt leaving for MRI.  PT to re-attempt tomorrow.   Ilda FoilGarrow, Dovey Fatzinger Rene 11/30/2013, 9:59 AM

## 2013-11-30 NOTE — Progress Notes (Signed)
Dr Robert BellowKrail in to see patient with new fluid orders.

## 2013-12-01 DIAGNOSIS — Z66 Do not resuscitate: Secondary | ICD-10-CM

## 2013-12-01 DIAGNOSIS — R531 Weakness: Secondary | ICD-10-CM

## 2013-12-01 DIAGNOSIS — Z515 Encounter for palliative care: Secondary | ICD-10-CM

## 2013-12-01 DIAGNOSIS — R0600 Dyspnea, unspecified: Secondary | ICD-10-CM

## 2013-12-01 DIAGNOSIS — K117 Disturbances of salivary secretion: Secondary | ICD-10-CM

## 2013-12-01 NOTE — Progress Notes (Signed)
Progress Note from the Palliative Medicine Team at [facility]  Subjective:    -call placed to family, patient appears comfortable, remains minimally responsive, opens eyes to stimulus  -discussed option of residential hospice, prognosis is likley days--discussed natural trajectory and expectations at end of life  -questions and concerns addressed   Objective: Allergies  Allergen Reactions  . Ciprofloxacin   . Doxycycline   . Lorazepam   . Paroxetine   . Sulfa Antibiotics   . Trazodone And Nefazodone    Scheduled Meds: . scopolamine  1 patch Transdermal Q72H   Continuous Infusions:  PRN Meds:.LORazepam, morphine injection  BP 115/60  Pulse 128  Temp(Src) 98.2 F (36.8 C) (Axillary)  Resp 22  Wt 43.772 kg (96 lb 8 oz)  SpO2 90%   PPS: 20 %     Intake/Output Summary (Last 24 hours) at 12/01/13 0831 Last data filed at 11/30/13 1031  Gross per 24 hour  Intake      0 ml  Output      0 ml  Net      0 ml       Physical Exam:  General: appears comfortable, NAD HEENT:  Dry buccal membranes, decrease in throat secretions since yesterday Chest:   Decreased in bases CVS: tachycardic Abdomen:soft NT decreased BS Ext: without edema Neuro: opens eyes to stimulus, unable to follow commands   Labs: CBC    Component Value Date/Time   WBC 12.7* 11/30/2013 0530   RBC 3.32* 11/30/2013 0530   HGB 10.3* 11/30/2013 0530   HCT 30.6* 11/30/2013 0530   PLT 373 11/30/2013 0530   MCV 92.2 11/30/2013 0530   MCH 31.0 11/30/2013 0530   MCHC 33.7 11/30/2013 0530   RDW 14.7 11/30/2013 0530   LYMPHSABS 0.5* 11/29/2013 1021   MONOABS 0.6 11/29/2013 1021   EOSABS 0.0 11/29/2013 1021   BASOSABS 0.0 11/29/2013 1021    BMET    Component Value Date/Time   NA 126* 11/30/2013 0530   K 4.1 11/30/2013 0530   CL 88* 11/30/2013 0530   CO2 25 11/30/2013 0530   GLUCOSE 129* 11/30/2013 0530   BUN 19 11/30/2013 0530   CREATININE 0.53 11/30/2013 0530   CALCIUM 9.2 11/30/2013 0530   GFRNONAA 82* 11/30/2013 0530   GFRAA >90 11/30/2013 0530    CMP     Component Value Date/Time   NA 126* 11/30/2013 0530   K 4.1 11/30/2013 0530   CL 88* 11/30/2013 0530   CO2 25 11/30/2013 0530   GLUCOSE 129* 11/30/2013 0530   BUN 19 11/30/2013 0530   CREATININE 0.53 11/30/2013 0530   CALCIUM 9.2 11/30/2013 0530   PROT 6.7 11/30/2013 0530   ALBUMIN 3.2* 11/30/2013 0530   AST 42* 11/30/2013 0530   ALT 11 11/30/2013 0530   ALKPHOS 59 11/30/2013 0530   BILITOT 0.5 11/30/2013 0530   GFRNONAA 82* 11/30/2013 0530   GFRAA >90 11/30/2013 0530    Assessment and Plan: 1. Code Status:DNR/DNI-comfort is main focus of care 2. Symptom Control:  Morphine: dyspnea and pain Ativan: for agitation 3. Psycho/Social:  Emotional support offered to patient and family 4. Disposition:  Family is open to residential inpatient hospice of [place], will write for choice  Patient Documents Completed or Given: Document Given Completed  Advanced Directives Pkt    MOST X   DNR    Gone from My Sight    Hard Choices X     Time In Time Out Total Time Spent with Patient Total  Overall Time  0800 0825 25 min 25 min    Greater than 50%  of this time was spent counseling and coordinating care related to the above assessment and plan.  Lorinda CreedMary Undrea Archbold NP  Palliative Medicine Team Team Phone # [phone] Pager [phone]  Discussed with Dr Nigel Mormonivitt 1

## 2013-12-01 NOTE — Progress Notes (Signed)
SLP Cancellation Note  Patient Details Name: Chloe GableLois C Gonzalez MRN: [id number] DOB: 09/26/1922   Cancelled treatment:       Reason Eval/Treat Not Completed: Other (comment). Pt will be full comfort. Discussed with Corrie DandyMary NP from Palliative care. SLP will sign off.    Lovelle Lema, Riley NearingBonnie Caroline 12/01/2013, 8:32 AM

## 2013-12-01 NOTE — Progress Notes (Signed)
PT Cancellation Note  Patient Details Name: Norval GableLois C Jernberg MRN: [id number] DOB: 07/21/1922   Cancelled Treatment:    Reason Eval/Treat Not Completed: PT screened, no needs identified, will sign off  Pt is full comfort care.  PT signing off.   Ilda FoilGarrow, Daiya Tamer Rene 12/01/2013, 11:06 AM

## 2013-12-01 NOTE — Progress Notes (Signed)
Subjective: Patient opened her eyes this morning with sternal rub. She appears comfortable. She is not making gurgling noises as much as yesterday. Patient on full comfort care. Palliative care helping to get her transferred to hospice care if she continues to be stable.  Objective: Vital signs in last 24 hours: Filed Vitals:   11/30/13 0420 11/30/13 0600 11/30/13 0934 11/30/13 2210  BP: 116/62 119/78 133/58 115/60  Pulse: 117 122 123 128  Temp: 98 F (36.7 C) 98 F (36.7 C) 98.6 F (37 C) 98.2 F (36.8 C)  TempSrc: Axillary Axillary Axillary Axillary  Resp: 24 22 22 22   Weight:      SpO2: 98% 98% 97% 90%   Weight change:  No intake or output data in the 24 hours ending 12/01/13 1148  Physical Exam General: sitting up in bed, appears comfortable HEENT: [place]/AT CV: tachycardic, no m/g/r Pulm: rhonchi bilaterally, mildly tachypnic, on 2 L oxygen via [place] Abd: BS+ Ext: warm, no edema Neuro: opened eyes to sternal rub, unable to follow commands  Lab Results: Basic Metabolic Panel:  Recent Labs Lab 11/29/13 1021 11/29/13 1028 11/30/13 0530  NA 129* 127* 126*  K 4.2 4.0 4.1  CL 90* 89* 88*  CO2 28  --  25  GLUCOSE 145* 148* 129*  BUN 16 16 19   CREATININE 0.54 0.70 0.53  CALCIUM 9.4  --  9.2   Liver Function Tests:  Recent Labs Lab 11/29/13 1021 11/30/13 0530  AST 15 42*  ALT 8 11  ALKPHOS 62 59  BILITOT <0.2* 0.5  PROT 6.7 6.7  ALBUMIN 3.2* 3.2*   No results found for this basename: LIPASE, AMYLASE,  in the last 168 hours No results found for this basename: AMMONIA,  in the last 168 hours CBC:  Recent Labs Lab 11/29/13 1021 11/29/13 1028 11/30/13 0530  WBC 7.0  --  12.7*  NEUTROABS 5.9  --   --   HGB 9.8* 11.2* 10.3*  HCT 29.2* 33.0* 30.6*  MCV 93.9  --  92.2  PLT 312  --  373   Cardiac Enzymes: No results found for this basename: CKTOTAL, CKMB, CKMBINDEX, TROPONINI,  in the last 168 hours BNP: No results found for this basename: PROBNP,  in  the last 168 hours D-Dimer: No results found for this basename: DDIMER,  in the last 168 hours CBG:  Recent Labs Lab 11/29/13 1021  GLUCAP 175*   Hemoglobin A1C:  Recent Labs Lab 11/29/13 1021  HGBA1C 4.5   Fasting Lipid Panel:  Recent Labs Lab 11/30/13 0530  CHOL 113  HDL 52  LDLCALC 46  TRIG 76  CHOLHDL 2.2   Thyroid Function Tests: No results found for this basename: TSH, T4TOTAL, FREET4, T3FREE, THYROIDAB,  in the last 168 hours Coagulation:  Recent Labs Lab 11/29/13 1021  LABPROT 13.5  INR 1.02   Anemia Panel: No results found for this basename: VITAMINB12, FOLATE, FERRITIN, TIBC, IRON, RETICCTPCT,  in the last 168 hours Urine Drug Screen: Drugs of Abuse     Component Value Date/Time   LABOPIA NONE DETECTED 11/29/2013 1555   COCAINSCRNUR NONE DETECTED 11/29/2013 1555   LABBENZ NONE DETECTED 11/29/2013 1555   AMPHETMU NONE DETECTED 11/29/2013 1555   THCU NONE DETECTED 11/29/2013 1555   LABBARB NONE DETECTED 11/29/2013 1555    Alcohol Level:  Recent Labs Lab 11/29/13 1021  ETH <11   Urinalysis:  Recent Labs Lab 11/29/13 1555  COLORURINE YELLOW  LABSPEC 1.009  PHURINE 8.5*  GLUCOSEU  NEGATIVE  HGBUR NEGATIVE  BILIRUBINUR NEGATIVE  KETONESUR NEGATIVE  PROTEINUR NEGATIVE  UROBILINOGEN 0.2  NITRITE NEGATIVE  LEUKOCYTESUR NEGATIVE     Micro Results: No results found for this or any previous visit (from the past 240 hour(s)). Studies/Results: Dg Chest 2 View  11/30/2013   CLINICAL DATA:  Shortness of breath.  Congestion.  EXAM: CHEST  2 VIEW  COMPARISON:  11/29/2013.  FINDINGS: Mediastinum and hilar structures normal. Heart size stable. No pulmonary venous congestion. No pleural effusion or pneumothorax. Right base subsegmental atelectasis noted. No acute bony abnormality.  IMPRESSION: Right base subsegmental atelectasis.   Electronically Signed   By: Maisie Fushomas  Register   On: 11/30/2013 10:23   Mr Maxine GlennMra Head Wo Contrast  11/30/2013    CLINICAL DATA:  80 year old hypertensive female presenting with left-sided weakness and slurred speech. Subsequent encounter.  EXAM: MRI HEAD WITHOUT CONTRAST  MRA HEAD WITHOUT CONTRAST  TECHNIQUE: Multiplanar, multiecho pulse sequences of the brain and surrounding structures were obtained without intravenous contrast. Angiographic images of the head were obtained using MRA technique without contrast.  COMPARISON:  11/29/2013.  FINDINGS: MRI HEAD FINDINGS  Moderate to large size acute nonhemorrhagic infarct right frontal operculum region, extending posterior aspect of the right operculum and into the right periatrial region, also involving the right subinsular region and small portion of the superior right temporal operculum region. Mild local mass effect without compression of the lateral ventricle.  Mild small vessel disease type changes.  Global atrophy without hydrocephalus.  No intracranial mass lesion noted on this unenhanced exam.  Cervical spondylotic changes with kyphosis centered at the C3-4 level. Anterior slippage of C3. Slight flattening of the cord at the C3-4 level. Findings incompletely assessed on present exam.  Opacification mastoid air cells greater on the left. No obstructing lesion posterior superior nasopharynx seen as causing eustachian tube dysfunction.  MRA HEAD FINDINGS  Exam is motion degraded.  Small aneurysmal bulge right internal carotid artery distal petrous segment.  Ectatic cavernous segment right internal carotid artery with mild narrowing.  Mild narrowing and irregularity left internal carotid artery cavernous segment.  No high-grade stenosis of the M1 segment of the middle cerebral artery on either side.  Decrease number of visualized right middle cerebral artery branches consistent with patient's acute infarct. The branches which are visualized appear narrowed and irregular bilaterally.  Moderate narrowing A1 segment more notable on the left.  Right vertebral artery is not  visualized throughout its entirety. The portions which are visualized appear significantly narrowed and irregular.  Poorly delineated right posterior inferior cerebellar artery.  Moderate narrowing proximal left posterior inferior cerebellar artery.  Mild to moderate narrowing proximal basilar artery. Mild narrowing distal basilar artery.  Mild to moderate narrowing superior cerebellar artery bilaterally.  Mild to slightly moderate narrowing portions of the posterior cerebral artery bilaterally.  IMPRESSION: MRI HEAD  Moderate to large size acute nonhemorrhagic infarct right frontal operculum region, extending posterior aspect of the right operculum and into the right periatrial region, also involving the right subinsular region and small portion of the superior right temporal operculum region. Mild local mass effect without compression of the lateral ventricle.  Global atrophy without hydrocephalus.  Cervical spondylotic changes with kyphosis centered at the C3-4 level. Anterior slippage of C3. Slight flattening of the cord at the C3-4 level. Findings incompletely assessed on present exam.  Opacification mastoid air cells greater on the left.  MRA HEAD  Exam is motion degraded.  Small aneurysmal bulge right internal carotid artery distal  petrous segment.  Ectatic cavernous segment right internal carotid artery with mild narrowing.  Mild narrowing and irregularity left internal carotid artery cavernous segment.  No high-grade stenosis of the M1 segment of the middle cerebral artery on either side.  Decrease number of visualized right middle cerebral artery branches consistent with patient's acute infarct. The branches which are visualized appear narrowed and irregular bilaterally.  Moderate narrowing A1 segment more notable on the left.  Right vertebral artery is not visualized throughout its entirety. The portions which are visualized appear significantly narrowed and irregular.  Poorly delineated right posterior  inferior cerebellar artery.  Moderate narrowing proximal left posterior inferior cerebellar artery.  Mild to moderate narrowing proximal basilar artery. Mild narrowing distal basilar artery.  Mild to moderate narrowing superior cerebellar artery bilaterally.  Mild to slightly moderate narrowing portions of the posterior cerebral artery bilaterally.   Electronically Signed   By: Bridgett LarssonSteve  Olson M.D.   On: 11/30/2013 18:05   Mr Brain Wo Contrast  11/30/2013   CLINICAL DATA:  80 year old hypertensive female presenting with left-sided weakness and slurred speech. Subsequent encounter.  EXAM: MRI HEAD WITHOUT CONTRAST  MRA HEAD WITHOUT CONTRAST  TECHNIQUE: Multiplanar, multiecho pulse sequences of the brain and surrounding structures were obtained without intravenous contrast. Angiographic images of the head were obtained using MRA technique without contrast.  COMPARISON:  11/29/2013.  FINDINGS: MRI HEAD FINDINGS  Moderate to large size acute nonhemorrhagic infarct right frontal operculum region, extending posterior aspect of the right operculum and into the right periatrial region, also involving the right subinsular region and small portion of the superior right temporal operculum region. Mild local mass effect without compression of the lateral ventricle.  Mild small vessel disease type changes.  Global atrophy without hydrocephalus.  No intracranial mass lesion noted on this unenhanced exam.  Cervical spondylotic changes with kyphosis centered at the C3-4 level. Anterior slippage of C3. Slight flattening of the cord at the C3-4 level. Findings incompletely assessed on present exam.  Opacification mastoid air cells greater on the left. No obstructing lesion posterior superior nasopharynx seen as causing eustachian tube dysfunction.  MRA HEAD FINDINGS  Exam is motion degraded.  Small aneurysmal bulge right internal carotid artery distal petrous segment.  Ectatic cavernous segment right internal carotid artery with  mild narrowing.  Mild narrowing and irregularity left internal carotid artery cavernous segment.  No high-grade stenosis of the M1 segment of the middle cerebral artery on either side.  Decrease number of visualized right middle cerebral artery branches consistent with patient's acute infarct. The branches which are visualized appear narrowed and irregular bilaterally.  Moderate narrowing A1 segment more notable on the left.  Right vertebral artery is not visualized throughout its entirety. The portions which are visualized appear significantly narrowed and irregular.  Poorly delineated right posterior inferior cerebellar artery.  Moderate narrowing proximal left posterior inferior cerebellar artery.  Mild to moderate narrowing proximal basilar artery. Mild narrowing distal basilar artery.  Mild to moderate narrowing superior cerebellar artery bilaterally.  Mild to slightly moderate narrowing portions of the posterior cerebral artery bilaterally.  IMPRESSION: MRI HEAD  Moderate to large size acute nonhemorrhagic infarct right frontal operculum region, extending posterior aspect of the right operculum and into the right periatrial region, also involving the right subinsular region and small portion of the superior right temporal operculum region. Mild local mass effect without compression of the lateral ventricle.  Global atrophy without hydrocephalus.  Cervical spondylotic changes with kyphosis centered at the C3-4 level. Anterior  slippage of C3. Slight flattening of the cord at the C3-4 level. Findings incompletely assessed on present exam.  Opacification mastoid air cells greater on the left.  MRA HEAD  Exam is motion degraded.  Small aneurysmal bulge right internal carotid artery distal petrous segment.  Ectatic cavernous segment right internal carotid artery with mild narrowing.  Mild narrowing and irregularity left internal carotid artery cavernous segment.  No high-grade stenosis of the M1 segment of the  middle cerebral artery on either side.  Decrease number of visualized right middle cerebral artery branches consistent with patient's acute infarct. The branches which are visualized appear narrowed and irregular bilaterally.  Moderate narrowing A1 segment more notable on the left.  Right vertebral artery is not visualized throughout its entirety. The portions which are visualized appear significantly narrowed and irregular.  Poorly delineated right posterior inferior cerebellar artery.  Moderate narrowing proximal left posterior inferior cerebellar artery.  Mild to moderate narrowing proximal basilar artery. Mild narrowing distal basilar artery.  Mild to moderate narrowing superior cerebellar artery bilaterally.  Mild to slightly moderate narrowing portions of the posterior cerebral artery bilaterally.   Electronically Signed   By: Bridgett Larsson M.D.   On: 11/30/2013 18:05   Medications: I have reviewed the patient's current medications. Scheduled Meds: . scopolamine  1 patch Transdermal Q72H   Continuous Infusions:  PRN Meds:.LORazepam, morphine injection Assessment/Plan: Ms. Marks is a 80yo woman, resident at [facility], w/ PMHx of HTN, HLD, CAD, dementia, depression, hyponatremia, and recent hospitalization for a small left parietal acute subdural hematoma after a fall on 08/15/13 who presented to the ED with altered mental status 2/2 right MCA stroke.   1. Acute Right MCA Stroke: Patient has poor prognosis. Palliative care was consulted. Family agreed to go to full comfort care. Patient only opens eyes to sternal rub. Unable to verbalize.  - Neurology on board, appreciate recommendations  - Palliative care on board, appreciate help and recommendations  - MRI/MRA Head-->  Moderate to large size acute nonhemorrhagic infarct right frontal operculum region, extending posterior aspect of the right operculum and into the right periatrial region, also involving the right subinsular region and small  portion of the superior right temporal operculum region - 2D Echocardiogram--> EF 20-25%, akinesis of anteroseptal myocardium and mid-apicalinferior myocardium - NPO  - Full comfort care- Morphine, Ativan  2. Hyponatremia: Na 129 on admission, at 126 on 10/26. Patient on full comfort care so will not be drawing any more labs.  3. HTN: BP 137/74 on admission. BP has remained stable in 110s-130s systolic. Patient has been tachycardic in 110s-120s.   4. Dementia: Patient lives at ALF. On full comfort care now.   5. Depression: Patient takes Mirtazepine 7.5 mg QHS at home. Holding all home meds.  Diet: NPO DVT/PE PPx: SCDs  Dispo: Likely will expire in next few days, will try to get to hospice care  The patient does have a current PCP (No Pcp Per Patient) and does need an [facility] hospital follow-up appointment after discharge.  The patient does not have transportation limitations that hinder transportation to clinic appointments.  .Services Needed at time of discharge: Y = Yes, Blank = No PT:   OT:   RN:   Equipment:   Other:     LOS: 2 days   Rich Number, MD 12/01/2013, 11:48 AM

## 2013-12-01 NOTE — Clinical Social Work Psychosocial (Signed)
Clinical Social Work Department BRIEF PSYCHOSOCIAL ASSESSMENT 12/01/2013  Patient:  [name],[name]     Account Number:  [id number]     Admit date:  11/29/2013  Clinical Social Worker:  Derenda FennelNIXON,Terrill Wauters, CLINICAL SOCIAL WORKER  Date/Time:  12/01/2013 03:04 PM  Referred by:  Physician  Date Referred:  12/01/2013 Referred for  Residential hospice placement   Other Referral:   Interview type:  Other - See comment Other interview type:   CSW spoke with pt's brother, [name] and sister-in-law, [name] via telephone.    PSYCHOSOCIAL DATA Living Status:  FACILITY Admitted from facility:   Level of care:  Assisted Living Primary support name:  [name] and Sonny MastersBarbara Clark [facility]HCPOA) Primary support relationship to patient:  [place] Degree of support available:   Strong    CURRENT CONCERNS Current Concerns  Post-Acute Placement   Other Concerns:    SOCIAL WORK ASSESSMENT / PLAN Clinical Social Worker spoke with pt's Health Care POA's, [name] (pt's brother) and [name] (pt's sister in law) in reference to residential hospice placement. Pt's sister-in-law reported they were aware of comfort care consult and [facility] [place]. CSW spoke with admissions intake coordinator, Gavin PoundDeborah [facility] and has faxed pt's clinicals. CSW awaiting bed offers and plans to notify pt's family of placement progression. CSW will continue to follow pt and pt's family to provide continued support and facilitate residential hospice placement needs.   Assessment/plan status:  Psychosocial Support/Ongoing Assessment of Needs Other assessment/ plan:   Information/referral to community resources:    PATIENT'S/FAMILY'S RESPONSE TO PLAN OF CARE: Pt has consult for comfort care. Pt asleep. Pt's brother and sister-in-law both agreeable to residential hospice placement.   Derenda FennelBashira Kartik Fernando, MSW, LCSWA [phone] 12/01/2013 3:24 PM

## 2013-12-01 NOTE — Progress Notes (Signed)
Nutrition Brief Note  Patient identified due to Low Braden Score   Wt Readings from Last 15 Encounters:  11/29/13 96 lb 8 oz (43.772 kg)    Per family at bedside pt is not allowed to eat and is being given morphine to make her comfortable. Per MD note, pt on full comfort care.  No nutrition interventions warranted at this time. If nutrition issues arise, please consult RD.   Ian Malkineanne Barnett RD, LDN Inpatient Clinical Dietitian Pager: [phone] After Hours Pager: [phone]

## 2013-12-01 NOTE — Progress Notes (Signed)
Pt seen and examined with Dr. Beckie Saltsivet. Please refer to resident note for detail  Pt is minimally responsive to voice or touch and opens eyes only to sternal rub   Exam:  Gen: minimally responsive CVS: tachycardic, normal heart sounds  Pulm: b/l rhonchi + Abd: soft, non tender, BS +  Ext: no pedal edema   Assessment and Plan:  80 y/o female with AMS likely secondary to CVA   Acute CVA:  - MRI with moderate to large R CVA - Neuro/palliative f/u noted - Pt is currently comfort care. - c/w morphine and ativan prn - Poor prognosis- hours to days - Will consider transfer to hospice if pt stable

## 2013-12-02 NOTE — Clinical Social Work Note (Signed)
Clinical Social Worker facilitated patient discharge including contacting patient family and facility to confirm patient discharge plans.  Clinical information faxed to facility and family agreeable with plan.  CSW arranged ambulance transport via [facility] [place].  RN to call report prior to discharge.  Clinical Social Worker will sign off for now as social work intervention is no longer needed. Please consult us again if new need arises.  Derenda FennelBashira Michelle Wnek, [place]CSWA [phone]

## 2013-12-02 NOTE — Progress Notes (Signed)
Subjective: Patient appears comfortable, resting in bed. Did not awaken to voices. Likely only has hours to days left.   Objective: Vital signs in last 24 hours: Filed Vitals:   11/30/13 2210 12/01/13 1423 Jun 22, 2013 0459 Jun 22, 2013 1023  BP: 115/60 83/45 129/52 111/56  Pulse: 128 124 119 115  Temp: 98.2 F (36.8 C) 97.6 F (36.4 C) 97.6 F (36.4 C) 97.5 F (36.4 C)  TempSrc: Axillary Axillary Oral Axillary  Resp: 22 24 26 24   Weight:      SpO2: 90% 92% 90% 90%   Weight change:  No intake or output data in the 24 hours ending Jun 22, 2013 1113  Physical Exam General: propped up in bed, resting comfortably, does not awake to voices HEENT: [place]/AT CV: tachycardic, no m/g/r Pulm: rhonchi bilaterally, tachypnic, on 2 L oxygen via [place] Abd: BS+ Ext: warm, no edema Neuro: did not awaken to voices or sternal rub, unable to follow commands or verbalize  Lab Results: Basic Metabolic Panel:  Recent Labs Lab 11/29/13 1021 11/29/13 1028 11/30/13 0530  NA 129* 127* 126*  K 4.2 4.0 4.1  CL 90* 89* 88*  CO2 28  --  25  GLUCOSE 145* 148* 129*  BUN 16 16 19   CREATININE 0.54 0.70 0.53  CALCIUM 9.4  --  9.2   Liver Function Tests:  Recent Labs Lab 11/29/13 1021 11/30/13 0530  AST 15 42*  ALT 8 11  ALKPHOS 62 59  BILITOT <0.2* 0.5  PROT 6.7 6.7  ALBUMIN 3.2* 3.2*   No results found for this basename: LIPASE, AMYLASE,  in the last 168 hours No results found for this basename: AMMONIA,  in the last 168 hours CBC:  Recent Labs Lab 11/29/13 1021 11/29/13 1028 11/30/13 0530  WBC 7.0  --  12.7*  NEUTROABS 5.9  --   --   HGB 9.8* 11.2* 10.3*  HCT 29.2* 33.0* 30.6*  MCV 93.9  --  92.2  PLT 312  --  373   Cardiac Enzymes: No results found for this basename: CKTOTAL, CKMB, CKMBINDEX, TROPONINI,  in the last 168 hours BNP: No results found for this basename: PROBNP,  in the last 168 hours D-Dimer: No results found for this basename: DDIMER,  in the last 168  hours CBG:  Recent Labs Lab 11/29/13 1021  GLUCAP 175*   Hemoglobin A1C:  Recent Labs Lab 11/29/13 1021  HGBA1C 4.5   Fasting Lipid Panel:  Recent Labs Lab 11/30/13 0530  CHOL 113  HDL 52  LDLCALC 46  TRIG 76  CHOLHDL 2.2   Thyroid Function Tests: No results found for this basename: TSH, T4TOTAL, FREET4, T3FREE, THYROIDAB,  in the last 168 hours Coagulation:  Recent Labs Lab 11/29/13 1021  LABPROT 13.5  INR 1.02   Anemia Panel: No results found for this basename: VITAMINB12, FOLATE, FERRITIN, TIBC, IRON, RETICCTPCT,  in the last 168 hours Urine Drug Screen: Drugs of Abuse     Component Value Date/Time   LABOPIA NONE DETECTED 11/29/2013 1555   COCAINSCRNUR NONE DETECTED 11/29/2013 1555   LABBENZ NONE DETECTED 11/29/2013 1555   AMPHETMU NONE DETECTED 11/29/2013 1555   THCU NONE DETECTED 11/29/2013 1555   LABBARB NONE DETECTED 11/29/2013 1555    Alcohol Level:  Recent Labs Lab 11/29/13 1021  ETH <11   Urinalysis:  Recent Labs Lab 11/29/13 1555  COLORURINE YELLOW  LABSPEC 1.009  PHURINE 8.5*  GLUCOSEU NEGATIVE  HGBUR NEGATIVE  BILIRUBINUR NEGATIVE  KETONESUR NEGATIVE  PROTEINUR NEGATIVE  UROBILINOGEN  0.2  NITRITE NEGATIVE  LEUKOCYTESUR NEGATIVE     Micro Results: No results found for this or any previous visit (from the past 240 hour(s)). Studies/Results: Mr Shirlee LatchMra Head Wo Contrast  11/30/2013   CLINICAL DATA:  80 year old hypertensive female presenting with left-sided weakness and slurred speech. Subsequent encounter.  EXAM: MRI HEAD WITHOUT CONTRAST  MRA HEAD WITHOUT CONTRAST  TECHNIQUE: Multiplanar, multiecho pulse sequences of the brain and surrounding structures were obtained without intravenous contrast. Angiographic images of the head were obtained using MRA technique without contrast.  COMPARISON:  11/29/2013.  FINDINGS: MRI HEAD FINDINGS  Moderate to large size acute nonhemorrhagic infarct right frontal operculum region, extending  posterior aspect of the right operculum and into the right periatrial region, also involving the right subinsular region and small portion of the superior right temporal operculum region. Mild local mass effect without compression of the lateral ventricle.  Mild small vessel disease type changes.  Global atrophy without hydrocephalus.  No intracranial mass lesion noted on this unenhanced exam.  Cervical spondylotic changes with kyphosis centered at the C3-4 level. Anterior slippage of C3. Slight flattening of the cord at the C3-4 level. Findings incompletely assessed on present exam.  Opacification mastoid air cells greater on the left. No obstructing lesion posterior superior nasopharynx seen as causing eustachian tube dysfunction.  MRA HEAD FINDINGS  Exam is motion degraded.  Small aneurysmal bulge right internal carotid artery distal petrous segment.  Ectatic cavernous segment right internal carotid artery with mild narrowing.  Mild narrowing and irregularity left internal carotid artery cavernous segment.  No high-grade stenosis of the M1 segment of the middle cerebral artery on either side.  Decrease number of visualized right middle cerebral artery branches consistent with patient's acute infarct. The branches which are visualized appear narrowed and irregular bilaterally.  Moderate narrowing A1 segment more notable on the left.  Right vertebral artery is not visualized throughout its entirety. The portions which are visualized appear significantly narrowed and irregular.  Poorly delineated right posterior inferior cerebellar artery.  Moderate narrowing proximal left posterior inferior cerebellar artery.  Mild to moderate narrowing proximal basilar artery. Mild narrowing distal basilar artery.  Mild to moderate narrowing superior cerebellar artery bilaterally.  Mild to slightly moderate narrowing portions of the posterior cerebral artery bilaterally.  IMPRESSION: MRI HEAD  Moderate to large size acute  nonhemorrhagic infarct right frontal operculum region, extending posterior aspect of the right operculum and into the right periatrial region, also involving the right subinsular region and small portion of the superior right temporal operculum region. Mild local mass effect without compression of the lateral ventricle.  Global atrophy without hydrocephalus.  Cervical spondylotic changes with kyphosis centered at the C3-4 level. Anterior slippage of C3. Slight flattening of the cord at the C3-4 level. Findings incompletely assessed on present exam.  Opacification mastoid air cells greater on the left.  MRA HEAD  Exam is motion degraded.  Small aneurysmal bulge right internal carotid artery distal petrous segment.  Ectatic cavernous segment right internal carotid artery with mild narrowing.  Mild narrowing and irregularity left internal carotid artery cavernous segment.  No high-grade stenosis of the M1 segment of the middle cerebral artery on either side.  Decrease number of visualized right middle cerebral artery branches consistent with patient's acute infarct. The branches which are visualized appear narrowed and irregular bilaterally.  Moderate narrowing A1 segment more notable on the left.  Right vertebral artery is not visualized throughout its entirety. The portions which are visualized appear significantly  narrowed and irregular.  Poorly delineated right posterior inferior cerebellar artery.  Moderate narrowing proximal left posterior inferior cerebellar artery.  Mild to moderate narrowing proximal basilar artery. Mild narrowing distal basilar artery.  Mild to moderate narrowing superior cerebellar artery bilaterally.  Mild to slightly moderate narrowing portions of the posterior cerebral artery bilaterally.   Electronically Signed   By: Bridgett Larsson M.D.   On: 11/30/2013 18:05   Mr Brain Wo Contrast  11/30/2013   CLINICAL DATA:  80 year old hypertensive female presenting with left-sided weakness and  slurred speech. Subsequent encounter.  EXAM: MRI HEAD WITHOUT CONTRAST  MRA HEAD WITHOUT CONTRAST  TECHNIQUE: Multiplanar, multiecho pulse sequences of the brain and surrounding structures were obtained without intravenous contrast. Angiographic images of the head were obtained using MRA technique without contrast.  COMPARISON:  11/29/2013.  FINDINGS: MRI HEAD FINDINGS  Moderate to large size acute nonhemorrhagic infarct right frontal operculum region, extending posterior aspect of the right operculum and into the right periatrial region, also involving the right subinsular region and small portion of the superior right temporal operculum region. Mild local mass effect without compression of the lateral ventricle.  Mild small vessel disease type changes.  Global atrophy without hydrocephalus.  No intracranial mass lesion noted on this unenhanced exam.  Cervical spondylotic changes with kyphosis centered at the C3-4 level. Anterior slippage of C3. Slight flattening of the cord at the C3-4 level. Findings incompletely assessed on present exam.  Opacification mastoid air cells greater on the left. No obstructing lesion posterior superior nasopharynx seen as causing eustachian tube dysfunction.  MRA HEAD FINDINGS  Exam is motion degraded.  Small aneurysmal bulge right internal carotid artery distal petrous segment.  Ectatic cavernous segment right internal carotid artery with mild narrowing.  Mild narrowing and irregularity left internal carotid artery cavernous segment.  No high-grade stenosis of the M1 segment of the middle cerebral artery on either side.  Decrease number of visualized right middle cerebral artery branches consistent with patient's acute infarct. The branches which are visualized appear narrowed and irregular bilaterally.  Moderate narrowing A1 segment more notable on the left.  Right vertebral artery is not visualized throughout its entirety. The portions which are visualized appear significantly  narrowed and irregular.  Poorly delineated right posterior inferior cerebellar artery.  Moderate narrowing proximal left posterior inferior cerebellar artery.  Mild to moderate narrowing proximal basilar artery. Mild narrowing distal basilar artery.  Mild to moderate narrowing superior cerebellar artery bilaterally.  Mild to slightly moderate narrowing portions of the posterior cerebral artery bilaterally.  IMPRESSION: MRI HEAD  Moderate to large size acute nonhemorrhagic infarct right frontal operculum region, extending posterior aspect of the right operculum and into the right periatrial region, also involving the right subinsular region and small portion of the superior right temporal operculum region. Mild local mass effect without compression of the lateral ventricle.  Global atrophy without hydrocephalus.  Cervical spondylotic changes with kyphosis centered at the C3-4 level. Anterior slippage of C3. Slight flattening of the cord at the C3-4 level. Findings incompletely assessed on present exam.  Opacification mastoid air cells greater on the left.  MRA HEAD  Exam is motion degraded.  Small aneurysmal bulge right internal carotid artery distal petrous segment.  Ectatic cavernous segment right internal carotid artery with mild narrowing.  Mild narrowing and irregularity left internal carotid artery cavernous segment.  No high-grade stenosis of the M1 segment of the middle cerebral artery on either side.  Decrease number of visualized right middle cerebral  artery branches consistent with patient's acute infarct. The branches which are visualized appear narrowed and irregular bilaterally.  Moderate narrowing A1 segment more notable on the left.  Right vertebral artery is not visualized throughout its entirety. The portions which are visualized appear significantly narrowed and irregular.  Poorly delineated right posterior inferior cerebellar artery.  Moderate narrowing proximal left posterior inferior cerebellar  artery.  Mild to moderate narrowing proximal basilar artery. Mild narrowing distal basilar artery.  Mild to moderate narrowing superior cerebellar artery bilaterally.  Mild to slightly moderate narrowing portions of the posterior cerebral artery bilaterally.   Electronically Signed   By: Bridgett Larsson M.D.   On: 11/30/2013 18:05   Medications: I have reviewed the patient's current medications. Scheduled Meds: . scopolamine  1 patch Transdermal Q72H   Continuous Infusions:  PRN Meds:.LORazepam, morphine injection Assessment/Plan: Ms. Fadness is a 80yo woman, resident at [facility], w/ PMHx of HTN, HLD, CAD, dementia, depression, hyponatremia, and recent hospitalization for a small left parietal acute subdural hematoma after a fall on 08/15/13 who presented to the ED with altered mental status 2/2 right MCA stroke.   1. Acute Right MCA Stroke: Patient has poor prognosis, likely only hours to a few days left. Palliative care on board. Social work found hospice placement for patient. Patient on full comfort care. Did not open eyes to voices, unable to verbalize.  - Palliative care on board, appreciate help and recommendations  - NPO  - Full comfort care- Morphine, Ativan  - Transferring to hospice care today  2. Hyponatremia: Na 129 on admission, at 126 on 10/26. Patient on full comfort care so will not be drawing any more labs.   3. HTN: BP 137/74 on admission. BP has remained stable in 80s-130s systolic. Patient has been tachycardic in 110s-120s.   4. Dementia: Patient lives at ALF. On full comfort care now.   5. Depression: Patient takes Mirtazepine 7.5 mg QHS at home. Holding all home meds.   Diet: NPO  DVT/PE PPx: SCDs  Dispo: Likely will expire in next hours to few days, transferring to hospice care today  The patient does have a current PCP (No Pcp Per Patient) and does need an [facility] hospital follow-up appointment after discharge.  The patient does not have transportation limitations  that hinder transportation to clinic appointments.  .Services Needed at time of discharge: Y = Yes, Blank = No PT:   OT:   RN:   Equipment:   Other:     LOS: 3 days   Rich Number, MD 12/19/2013, 11:13 AM

## 2013-12-02 NOTE — Progress Notes (Signed)
Pt seen and examined with Dr. Beckie Saltsivet. Please refer to resident note for detail   Pt is minimally responsive to voice or touch. Appears comfortable  Exam:  Gen: minimally responsive  CVS: tachycardic, normal heart sounds  Pulm: b/l rhonchi +  Abd: soft, non tender, BS +  Ext: no pedal edema   Assessment and Plan:  80 y/o female with AMS likely secondary to CVA   Acute CVA:  - MRI with moderate to large R CVA  - Neuro/palliative f/u noted  - Pt is currently comfort care.  - c/w morphine and ativan prn  - Poor prognosis- hours to days  - Pt for transfer to inpatient hospice today

## 2013-12-02 NOTE — Progress Notes (Signed)
D/C orders received. Pt dressed for transport and belongings packed. Writer called report to [facility]. [facility] picked up pt for transport.

## 2013-12-02 NOTE — Discharge Summary (Signed)
Name: [name] MRN: [id number] DOB: [birth year] 80 y.o. PCP: No Pcp Per Patient  Date of Admission: 11/29/2013 10:17 AM Date of Discharge: 2013/12/10 Attending Physician: Earl Lagos, MD  Discharge Diagnosis: 1.  Principal Problem:   CVA (cerebral infarction) Active Problems:   DNR (do not resuscitate)   Palliative care encounter   Weakness generalized   Dyspnea   Increased oropharyngeal secretions  Discharge Medications:   Medication List    STOP taking these medications       acetaminophen 325 MG tablet  Commonly known as:  TYLENOL     albuterol 108 (90 BASE) MCG/ACT inhaler  Commonly known as:  PROVENTIL HFA;VENTOLIN HFA     calcium-vitamin D 500-200 MG-UNIT per tablet  Commonly known as:  OSCAL WITH D     ferrous sulfate 325 (65 FE) MG tablet     fluticasone 50 MCG/ACT nasal spray  Commonly known as:  FLONASE     hydrochlorothiazide 25 MG tablet  Commonly known as:  HYDRODIURIL     loratadine 10 MG tablet  Commonly known as:  CLARITIN     magnesium hydroxide 400 MG/5ML suspension  Commonly known as:  MILK OF MAGNESIA     meclizine 25 MG tablet  Commonly known as:  ANTIVERT     metoprolol succinate 50 MG 24 hr tablet  Commonly known as:  TOPROL-XL     mirtazapine 7.5 MG tablet  Commonly known as:  REMERON     omeprazole 20 MG capsule  Commonly known as:  PRILOSEC     senna 8.6 MG Tabs tablet  Commonly known as:  SENOKOT     vitamin B-12 1000 MCG tablet  Commonly known as:  CYANOCOBALAMIN        Disposition and follow-up:   [name] was discharged from [facility] in Stable condition.  At the hospital follow up visit please address:  1.  Full comfort care- Family has agreed to full comfort care and DNR, will transfer to hospice  2.  Labs / imaging needed at time of follow-up: None  3.  Pending labs/ test needing follow-up: None  Follow-up Appointments:   Discharge Instructions: Discharge  Instructions   Diet - low sodium heart healthy    Complete by:  As directed      Increase activity slowly    Complete by:  As directed            Consultations: Treatment Team:  Palliative [facility]  Procedures Performed:  Dg Chest 2 View  11/30/2013   CLINICAL DATA:  Shortness of breath.  Congestion.  EXAM: CHEST  2 VIEW  COMPARISON:  11/29/2013.  FINDINGS: Mediastinum and hilar structures normal. Heart size stable. No pulmonary venous congestion. No pleural effusion or pneumothorax. Right base subsegmental atelectasis noted. No acute bony abnormality.  IMPRESSION: Right base subsegmental atelectasis.   Electronically Signed   By: Maisie Fus  Register   On: 11/30/2013 10:23   Ct Head Wo Contrast  11/29/2013   CLINICAL DATA:  80 year old female presenting with acute onset of left-sided weakness and garbled speech. Code stroke.  EXAM: CT HEAD WITHOUT CONTRAST  TECHNIQUE: Contiguous axial images were obtained from the base of the skull through the vertex without intravenous contrast.  COMPARISON:  No priors.  FINDINGS: There is loss of gray-white differentiation in the posterior right frontal and anterior right parietal lobe, concerning for acute or early subacute ischemia. Additionally, there is loss of gray-white differentiation in the region of  the right insular ribbon. No signs of acute intracranial hemorrhage. No focal mass, mass effect, hydrocephalus or abnormal intra or extra-axial fluid collections. Mild cerebral atrophy. Patchy and confluent areas of decreased attenuation are noted throughout the deep and periventricular white matter of the cerebral hemispheres bilaterally, compatible with chronic microvascular ischemic disease. Visualized paranasal sinuses and mastoids are well pneumatized, with exception of some mild multifocal mucosal thickening in the ethmoid sinuses bilaterally. No acute displaced skull fractures.  IMPRESSION: 1. Findings, as above, concerning for acute or early subacute  ischemia in the right MCA distribution. 2. Mild cerebral atrophy with chronic microvascular ischemic changes in the cerebral white matter. Critical Value/emergent results were called by telephone at the time of interpretation on 11/29/2013 at 10:36 am to Dr. Leroy Kennedyamilo, who verbally acknowledged these results.   Electronically Signed   By: Trudie Reedaniel  Entrikin M.D.   On: 11/29/2013 10:39   Mr Shirlee LatchMra Head Wo Contrast  11/30/2013   CLINICAL DATA:  80 year old hypertensive female presenting with left-sided weakness and slurred speech. Subsequent encounter.  EXAM: MRI HEAD WITHOUT CONTRAST  MRA HEAD WITHOUT CONTRAST  TECHNIQUE: Multiplanar, multiecho pulse sequences of the brain and surrounding structures were obtained without intravenous contrast. Angiographic images of the head were obtained using MRA technique without contrast.  COMPARISON:  11/29/2013.  FINDINGS: MRI HEAD FINDINGS  Moderate to large size acute nonhemorrhagic infarct right frontal operculum region, extending posterior aspect of the right operculum and into the right periatrial region, also involving the right subinsular region and small portion of the superior right temporal operculum region. Mild local mass effect without compression of the lateral ventricle.  Mild small vessel disease type changes.  Global atrophy without hydrocephalus.  No intracranial mass lesion noted on this unenhanced exam.  Cervical spondylotic changes with kyphosis centered at the C3-4 level. Anterior slippage of C3. Slight flattening of the cord at the C3-4 level. Findings incompletely assessed on present exam.  Opacification mastoid air cells greater on the left. No obstructing lesion posterior superior nasopharynx seen as causing eustachian tube dysfunction.  MRA HEAD FINDINGS  Exam is motion degraded.  Small aneurysmal bulge right internal carotid artery distal petrous segment.  Ectatic cavernous segment right internal carotid artery with mild narrowing.  Mild narrowing and  irregularity left internal carotid artery cavernous segment.  No high-grade stenosis of the M1 segment of the middle cerebral artery on either side.  Decrease number of visualized right middle cerebral artery branches consistent with patient's acute infarct. The branches which are visualized appear narrowed and irregular bilaterally.  Moderate narrowing A1 segment more notable on the left.  Right vertebral artery is not visualized throughout its entirety. The portions which are visualized appear significantly narrowed and irregular.  Poorly delineated right posterior inferior cerebellar artery.  Moderate narrowing proximal left posterior inferior cerebellar artery.  Mild to moderate narrowing proximal basilar artery. Mild narrowing distal basilar artery.  Mild to moderate narrowing superior cerebellar artery bilaterally.  Mild to slightly moderate narrowing portions of the posterior cerebral artery bilaterally.  IMPRESSION: MRI HEAD  Moderate to large size acute nonhemorrhagic infarct right frontal operculum region, extending posterior aspect of the right operculum and into the right periatrial region, also involving the right subinsular region and small portion of the superior right temporal operculum region. Mild local mass effect without compression of the lateral ventricle.  Global atrophy without hydrocephalus.  Cervical spondylotic changes with kyphosis centered at the C3-4 level. Anterior slippage of C3. Slight flattening of the cord at the  C3-4 level. Findings incompletely assessed on present exam.  Opacification mastoid air cells greater on the left.  MRA HEAD  Exam is motion degraded.  Small aneurysmal bulge right internal carotid artery distal petrous segment.  Ectatic cavernous segment right internal carotid artery with mild narrowing.  Mild narrowing and irregularity left internal carotid artery cavernous segment.  No high-grade stenosis of the M1 segment of the middle cerebral artery on either side.   Decrease number of visualized right middle cerebral artery branches consistent with patient's acute infarct. The branches which are visualized appear narrowed and irregular bilaterally.  Moderate narrowing A1 segment more notable on the left.  Right vertebral artery is not visualized throughout its entirety. The portions which are visualized appear significantly narrowed and irregular.  Poorly delineated right posterior inferior cerebellar artery.  Moderate narrowing proximal left posterior inferior cerebellar artery.  Mild to moderate narrowing proximal basilar artery. Mild narrowing distal basilar artery.  Mild to moderate narrowing superior cerebellar artery bilaterally.  Mild to slightly moderate narrowing portions of the posterior cerebral artery bilaterally.   Electronically Signed   By: Bridgett Larsson M.D.   On: 11/30/2013 18:05   Mr Brain Wo Contrast  11/30/2013   CLINICAL DATA:  80 year old hypertensive female presenting with left-sided weakness and slurred speech. Subsequent encounter.  EXAM: MRI HEAD WITHOUT CONTRAST  MRA HEAD WITHOUT CONTRAST  TECHNIQUE: Multiplanar, multiecho pulse sequences of the brain and surrounding structures were obtained without intravenous contrast. Angiographic images of the head were obtained using MRA technique without contrast.  COMPARISON:  11/29/2013.  FINDINGS: MRI HEAD FINDINGS  Moderate to large size acute nonhemorrhagic infarct right frontal operculum region, extending posterior aspect of the right operculum and into the right periatrial region, also involving the right subinsular region and small portion of the superior right temporal operculum region. Mild local mass effect without compression of the lateral ventricle.  Mild small vessel disease type changes.  Global atrophy without hydrocephalus.  No intracranial mass lesion noted on this unenhanced exam.  Cervical spondylotic changes with kyphosis centered at the C3-4 level. Anterior slippage of C3. Slight  flattening of the cord at the C3-4 level. Findings incompletely assessed on present exam.  Opacification mastoid air cells greater on the left. No obstructing lesion posterior superior nasopharynx seen as causing eustachian tube dysfunction.  MRA HEAD FINDINGS  Exam is motion degraded.  Small aneurysmal bulge right internal carotid artery distal petrous segment.  Ectatic cavernous segment right internal carotid artery with mild narrowing.  Mild narrowing and irregularity left internal carotid artery cavernous segment.  No high-grade stenosis of the M1 segment of the middle cerebral artery on either side.  Decrease number of visualized right middle cerebral artery branches consistent with patient's acute infarct. The branches which are visualized appear narrowed and irregular bilaterally.  Moderate narrowing A1 segment more notable on the left.  Right vertebral artery is not visualized throughout its entirety. The portions which are visualized appear significantly narrowed and irregular.  Poorly delineated right posterior inferior cerebellar artery.  Moderate narrowing proximal left posterior inferior cerebellar artery.  Mild to moderate narrowing proximal basilar artery. Mild narrowing distal basilar artery.  Mild to moderate narrowing superior cerebellar artery bilaterally.  Mild to slightly moderate narrowing portions of the posterior cerebral artery bilaterally.  IMPRESSION: MRI HEAD  Moderate to large size acute nonhemorrhagic infarct right frontal operculum region, extending posterior aspect of the right operculum and into the right periatrial region, also involving the right subinsular region and small portion  of the superior right temporal operculum region. Mild local mass effect without compression of the lateral ventricle.  Global atrophy without hydrocephalus.  Cervical spondylotic changes with kyphosis centered at the C3-4 level. Anterior slippage of C3. Slight flattening of the cord at the C3-4 level.  Findings incompletely assessed on present exam.  Opacification mastoid air cells greater on the left.  MRA HEAD  Exam is motion degraded.  Small aneurysmal bulge right internal carotid artery distal petrous segment.  Ectatic cavernous segment right internal carotid artery with mild narrowing.  Mild narrowing and irregularity left internal carotid artery cavernous segment.  No high-grade stenosis of the M1 segment of the middle cerebral artery on either side.  Decrease number of visualized right middle cerebral artery branches consistent with patient's acute infarct. The branches which are visualized appear narrowed and irregular bilaterally.  Moderate narrowing A1 segment more notable on the left.  Right vertebral artery is not visualized throughout its entirety. The portions which are visualized appear significantly narrowed and irregular.  Poorly delineated right posterior inferior cerebellar artery.  Moderate narrowing proximal left posterior inferior cerebellar artery.  Mild to moderate narrowing proximal basilar artery. Mild narrowing distal basilar artery.  Mild to moderate narrowing superior cerebellar artery bilaterally.  Mild to slightly moderate narrowing portions of the posterior cerebral artery bilaterally.   Electronically Signed   By: Bridgett Larsson M.D.   On: 11/30/2013 18:05   Dg Chest Portable 1 View  11/29/2013   CLINICAL DATA:  Stroke. Patient with left-sided weakness and incomprehensible speech. Unable to follow commands.  EXAM: PORTABLE CHEST - 1 VIEW  COMPARISON:  08/12/2009  FINDINGS: Semi-erect portable study. Cardiac silhouette is normal in size. Aorta is mildly uncoiled. No mediastinal or hilar masses. Lungs are clear. No pleural effusion or pneumothorax.  Bony thorax is demineralized but grossly intact.  IMPRESSION: No acute cardiopulmonary disease.   Electronically Signed   By: Amie Portland M.D.   On: 11/29/2013 11:59    2D Echo:  11/30/13 Study Conclusions - Left ventricle: The  cavity size was normal. Wall thickness was normal. Systolic function was severely reduced. The estimated ejection fraction was in the range of 20% to 25%. There is akinesis of the anteroseptal myocardium. There is akinesis of the mid-apicalinferior myocardium. - Aortic valve: There was mild regurgitation. - Pericardium, extracardiac: A trivial pericardial effusion was identified.  Impressions: - Anteroseptal, apical and distal inferior akinesis; overall severely reduced LV function; mild AI; cannot R/O apical thrombus; suggest fu study with contrast to exclude.   Admission HPI: [name] is a 80yo woman, resident at [facility], w/ PMHx of HTN, HLD, CAD, dementia, depression, hyponatremia, and recent hospitalization for a small left parietal acute subdural hematoma after a fall on 08/15/13 who presented to the ED with altered mental status. History was obtained through medical records and family members. Per nursing staff at ALF, patient was noted to be in normal state of health at 7:45 this morning, had breakfast, and then was returning to her room when she was found to have left-sided weakness and "garbled" speech. EMS was called and brought the patient to the hospital. Per patient's brother and sister-in-law, they saw the patient yesterday at the ALF and she was more tired than usual, but able to speak with them clearly.   In the ED, neurology was consulted. CT Head showed loss of gray-white differentiation in the posterior right frontal and anterior right parietal lobe, concerning for acute or early subacute ischemia in  the MCA distribution. Patient was not a candidate for IV thrombolysis given recent cerebral bleeding.    Hospital Course by problem list: Principal Problem:   CVA (cerebral infarction) Active Problems:   DNR (do not resuscitate)   Palliative care encounter   Weakness generalized   Dyspnea   Increased oropharyngeal secretions   [name] is a 80yo woman, resident at  [facility], w/ PMHx of HTN, HLD, CAD, dementia, depression, hyponatremia, and recent hospitalization for a small left parietal acute subdural hematoma after a fall on 08/15/13 who presented to the ED with altered mental status 2/2 right MCA stroke.    1. Acute Right MCA Stroke: Patient presented with AMS after being found with left-sided weakness and dysarthria at 9 AM the morning of admission. Patient was in her normal state of health at 7:45 AM per nursing facility staff. On exam, patient had dysarthria and was unable to follow commands. She was found to have right MCA stroke on CT. She was not a candidate for TPA given recent intracranial bleeding. Neurology was consulted and evaluated patient. MRI/MRA confirmed CT results. Patient's mental status continued to decline. She continued to be non-verbal and only opened her eyes to sternal rub. She was unable to follow commands. She remained tachycardic, tachypnic, and unable to handle secretions. Palliative care was consulted and family agreed to full comfort care, which included morphine as needed to help with breathing, Ativan PRN, and suctioning of oral secretions. Social work was consulted and found hospice placement for the patient. Patient will be transferred to hospice care as wished by the family.   2. Hyponatremia: Na 129 on admission. During last hospitalization at [facility] in July 2015, patient found to be hyponatremic at 120 on admission. She was given 1.5% hypertonic saline and her sodium remained stable at 127-128 after discontinuation of the hypertonic saline. 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 She was discharged with instructions to continue free water restriction. Her last Na was 126 on 10/26. Once patient was placed on full comfort care on 10/26 no additional labs were drawn.   3. HTN: BP 137/74 on admission. Her BP remained between 80s-130s systolic. She takes HCTZ 25 mg daily and Metoprolol 50 mg BID at home. These  medications were held due to her acute stroke. Her home meds were ultimately discontinued because of worsening mental status and she was placed on full comfort care.   4. Dementia: Patient coming from [facility] in [place], [place]. Per her family, at her baseline she is able to feed herself and get around by wheelchair. However, due to patient's severe stroke she was placed on full comfort care.   5. Depression: Patient takes Mirtazepine 7.5 mg QHS at home. This medication was held initially due to her stroke, but then patient's status worsened and family opted for full comfort care. All home meds discontinued.   Discharge Vitals:   BP 111/56  Pulse 115  Temp(Src) 97.5 F (36.4 C) (Axillary)  Resp 24  Wt 96 lb 8 oz (43.772 kg)  SpO2 90% Physical Exam  General: sitting up in bed, appears comfortable  HEENT: [place]/AT  CV: tachycardic, no m/g/r  Pulm: rhonchi bilaterally, mildly tachypnic, on 2 L oxygen via [place]  Abd: BS+  Ext: warm, no edema  Neuro: opened eyes to sternal rub, unable to follow commands  Discharge Labs:  No results found for this or any previous visit (from the past 24 hour(s)).  Signed: Rich Number,  MD Jun 30, 2013, 11:24 AM    Services Ordered on Discharge: Hospice Equipment Ordered on Discharge: None

## 2013-12-02 NOTE — Progress Notes (Signed)
Clinical Social Worker spoke with [facility] nurse, [facility]. CSW to submit additional clinicals and arrange transportation once discharge orders are completed. CSW continues to facilitate discharge needs.    Derenda FennelBashira Pablo Stauffer, MSW, LCSWA [phone] 09-12-2013 10:47 AM

## 2013-12-02 NOTE — Discharge Summary (Signed)
INTERNAL MEDICINE ATTENDING DISCHARGE COSIGN   I evaluated the patient on the day of discharge and discussed the discharge plan with my resident team. I agree with the discharge documentation and disposition.   Ahad Colarusso 09-13-2013, 1:41 PM

## 2013-12-06 DEATH — deceased

## 2014-05-25 NOTE — Discharge Summary (Signed)
PATIENT NAME:  Chloe GableBARBER, Liahna C MR#:  [id number] DATE OF BIRTH:  1923/01/24  DATE OF ADMISSION:  12/20/2011 DATE OF DISCHARGE:  12/25/2011   Discharge summary was done yesterday but she was not discharged because of the rash which is pruritic and the patient was having so much distress because of the rash. Today the rash is better. She still has some itching so she can take the Benadryl and continue the prednisone as scheduled. She already stopped the Rocephin. She can continue the Zithromax. [facility] [place] Discussed the plan with brother and sister at bedside.   ____________________________ Katha HammingSnehalatha Jennelle Pinkstaff, MD sk:drc D: 12/25/2011 13:08:22 ET T: 12/25/2011 13:11:30 ET JOB#: [id number]  cc: Katha HammingSnehalatha Raima Geathers, MD, <Dictator> Katha HammingSNEHALATHA Zahra Peffley MD ELECTRONICALLY SIGNED 01/22/2012 15:00

## 2014-05-25 NOTE — Discharge Summary (Signed)
PATIENT NAME:  Chloe GableBARBER, Marilou C MR#:  [id number] DATE OF BIRTH:  May 21, 1922  DATE OF ADMISSION:  12/20/2011 DATE OF DISCHARGE:  12/24/2011   DISCHARGE DIAGNOSES:  1. Altered mental status secondary to metabolic encephalopathy from bronchitis.  2. Mild dementia.  3. History of coronary artery disease.  4. Hypertension.  5. Pruritic rash.   DISCHARGE MEDICATIONS:  1. Zoloft 50 mg p.o. daily. 2. Cetirizine 10 mg daily.  3. Certa-Vite with antioxidants one tablet daily.  4. Caltrate with Vitamin D 1 tablet daily.  5. Omeprazole 40 mg p.o. daily.  6. Ambien 5 mg needed for sleep. 7. Flonase 50 mcg two sprays once a day.  8. Simvastatin 40 mg daily.  9. Advair Diskus 250/50 one puff b.i.d.  10. Robafen 10/100 10 mL every four hours as needed.  11. Mapap 325 mg 1 tablet every six hours as needed for pain.  12. Azithromycin 500 mg p.o. daily for three days.  13. Prednisone 20 mg 2 tablets daily for two days, 1 tablet daily for two days, and then stop.  14. Hydrocortisone topical application to rash area twice daily as needed.   DIET: Low sodium diet.   CONSULTATIONS: None.   HOSPITAL COURSE: This is an 81 year old female patient with hypertension admitted for delirium and altered mental status. Look at the history and physical for full details. She was admitted for altered mental status. She had lots of cough when she came. She was given Levaquin as an outpatient for UTI. She took it for two days. She came in here for severe cough and started on Rocephin and Zithromax. Chest x-ray clear with no evidence of pneumonia. Her cough improved nicely and she was afebrile and saturating at 97% on room air. Blood cultures have been negative so we are going to let her go with oral Zithromax. The patient developed a rash since yesterday afternoon. The patient was started on Aricept along with ProAir but the nurse said that the rash started even before giving the Aricept and ProAir. The rash is pruritic and  erythematous rash, looks like likely due to bedbugs. The patient is given a dose of Benadryl and tapering steroid prescription. She already received Solu-Medrol 60 one dose. Aricept and ProAir are stopped. [facility] [place] She will be going back there. Discussed the plan yesterday with the brother and sister-in-law. For hypertension she can continue her medication metoprolol 50 mg p.o. b.i.d.   TIME SPENT ON DISCHARGE PREPARATION: More than 30 minutes.   ____________________________ Katha HammingSnehalatha Nakaila Freeze, MD sk:drc D: 12/24/2011 13:18:09 ET T: 12/24/2011 13:34:09 ET JOB#: [id number]  cc: Katha HammingSnehalatha Taela Charbonneau, MD, <Dictator> Katha HammingSNEHALATHA Kyndall Amero MD ELECTRONICALLY SIGNED 01/22/2012 15:02

## 2014-05-25 NOTE — H&P (Signed)
PATIENT NAME:  [name], [name] MR#:  [id number] DATE OF BIRTH:  12-Nov-1922  DATE OF ADMISSION:  12/20/2011  PRIMARY CARE PHYSICIAN: Dr. Alonna Buckler   REASON FOR ADMISSION: Delirium, altered mental status.   HISTORY OF PRESENT ILLNESS: [name] is a very nice 81 year old female who has history of multiple problems including hypertension, hyperlipidemia, osteoporosis, medical coronary artery disease, anxiety and depression and mild dementia. The patient is a patient a skilled nursing facility/nursing home at the [facility] for what she has been there on a chronic basis. Patient started getting really confused this morning, not even recognizing her family and being very lethargic. After a while she started getting a little bit better but still continued to be very confused. She is delirious with apparently hallucinations this morning but now have resolved. Patient is still not to her baseline for what she is getting admitted for evaluation of her altered mental status. The patient does not have any neurologic deficits and she has been overall awake most of the time.   REVIEW OF SYSTEMS: Unable to obtain from the patient due to confusion. She states she is hurting in the suprapubic area and she states that she has been coughing and coughing and coughing and her family can correlate this, that she has been having increased secretions with phlegm which is yellowish for the last two days. Patient hasn't been eating or drinking much.   PAST MEDICAL HISTORY:  1. Hyperlipidemia.  2. Medical coronary artery disease.  3. Hypertension.  4. Osteoporosis with history of previous compression fractures.  5. Anxiety.  6. Depression.  7. Dementia.  8. Carpal tunnel syndrome. 9. History of diverticulosis and diverticulitis.   10. Chronic pain syndrome.  11. Urinary retention in the past.  12. Multiple UTIs.  13. History of pancreatic mass.  14. Reactive airway disease.   SOCIAL HISTORY: Patient  lives at [facility]. She denies any smoking, alcohol or drug abuse.   FAMILY HISTORY: History of heart disease and diabetes in her parents. Her brother correlates that history.   PAST SURGICAL HISTORY: Unable to obtain.   ALLERGIES: Patient is allergic to multiple medications including citalopram, doxycycline, lorazepam, paroxetine, sulfa drugs and trazodone.    CURRENT MEDICATIONS:  1. Advair Diskus 250 mg/50 twice daily.  2. Caltrate 600 mg plus vitamin D.  3. Cefuroxime 500 mg twice daily, this being started after her urine infection for the past couple of days.  4. Multivitamins.  5. Cetirizine 10 mg daily.  6. Dicyclomine 10 mg twice daily.  7. Fluticasone 50 mcg inhaled nasal. 8. Tylenol p.r.n.  9. Meclizine 25 mg three times a day.  10. Metoprolol succinate 50 mg twice daily.  11. Milk of magnesia p.r.n.  12. Omeprazole 40 mg daily.  13. Robafen DM for cough p.r.n.  14. Sertraline 50 mg once a day.  15. Simvastatin 40 mg once a day.  16. Tylenol.  17. Ventolin.  18. Zolpidem 5 mg at bedtime.   PHYSICAL EXAMINATION:  VITAL SIGNS: Blood pressure 155/71, pulse 76, respiratory rate 16, temperature 96.5.   GENERAL: Patient is on no significant distress but she is pleasantly confused and slightly agitated.   HEENT: Her pupils are equal and reactive. Extraocular movements are intact. Mucosa are very dry. No oral lesions. No oropharyngeal exudates. Eyes anicteric sclerae. Pink conjunctivae.  NECK: Supple. No JVD. No thyromegaly. No adenopathy. No masses. No carotid bruits. Range of motion is normal.    CARDIOVASCULAR: Regular rate and  rhythm. No murmurs, rubs or gallops. No displacement of PMI. No thrills.   LUNGS: Patient has significant rales and rhonchi especially on her left lower lobe. There is no use of accessory muscles. There is no dullness to percussion.   ABDOMEN: Soft, nontender, nondistended. No hepatosplenomegaly. Mildly tender on the suprapubic area  but no rebound. Bowel sounds are positive.   EXTREMITIES: No edema, no cyanosis, no clubbing.   SKIN: Without any rashes or petechiae.   MUSCULOSKELETAL: No joint deformity. No effusions in joints.   NEUROLOGIC: Cranial nerves II through XII intact. Strength is 5/5 in all four extremities. Sensation is maintained. She is alert and oriented to person only.  PSYCH: Very anxious patient.   LYMPHATICS: Negative for lymphadenopathy in neck or supraclavicular area.   LABORATORY, DIAGNOSTIC AND RADIOLOGICAL DATA: BUN 25, creatinine 0.84, serum sodium 133. LFTs within normal limits. Troponins are negative. White count 8, hemoglobin 12.8. Urinalysis negative nitrites, leukocyte esterase, red blood cells or white blood cells. It is important to mention the patient had a positive urinalysis within a week and that she was treated partially with levofloxacin for only two days and the patient now has been put on cefuroxime for about a day and so this could be a partially treated urinary tract infection.    ASSESSMENT AND PLAN:  1. Altered mental status/delirium. Her altered mental status has improved a little bit. She is not as confused as the morning but she still is delirious in the morning. Had hallucinations but not anymore. The patient is admitted to rule out any possible infection. She could have upper respiratory infection. Her x-ray shows bibasilar atelectasis although this could be the beginning of pneumonia. We are going to check chest x-ray in the morning AP and lateral just to evaluate this a little bit better. We are going to start treatment with Levaquin and azithromycin and follow up. Will stop this medication if the patient does not have any significant exudates in the morning although we have to continue to treat urinary tract infection at least for a good five days so the patient has an incomplete treated urinary tract infection. We are going to send urine for cultures.   2. Coronary artery  disease. Continue beta blocker and aspirin.  3. Hypertension. Continue control of her blood pressure here in the hospital.  4. Dehydration. Patient looks moderate to severely dehydrated. Her BUN is elevated. Her sodium is low. We are going to give her 1 liter of fluid slowly since the patient has coronary artery disease.  5. Other medical problems seem to be stable.  I discussed the case with her family.   TIME SPENT: I spent about 40 minutes with this admission.   ____________________________ Felipa Furnaceoberto Sanchez Gutierrez, MD rsg:cms D: 12/20/2011 17:24:25 ET T: 12/21/2011 06:05:45 ET JOB#: [id number]  cc: Felipa Furnaceoberto Sanchez Gutierrez, MD, <Dictator> Reola MosherAndrew S. Randa LynnLamb, MD Regan RakersOBERTO Juanda ChanceSANCHEZ GUTIERRE MD ELECTRONICALLY SIGNED 12/23/2011 17:14

## 2014-05-28 NOTE — Consult Note (Signed)
Chief Complaint:  Subjective/Chief Complaint Only the patient in the room. Denies diarrhea now. Still with low abd pain.   VITAL SIGNS/ANCILLARY NOTES: **Vital Signs.:   04-May-14 04:05  Vital Signs Type Routine  Temperature Temperature (F) 97.7  Celsius 36.5  Temperature Source oral  Pulse Pulse 56  Respirations Respirations 20  Systolic BP Systolic BP 102  Diastolic BP (mmHg) Diastolic BP (mmHg) 78  Mean BP 98  Pulse Ox % Pulse Ox % 96  Pulse Ox Activity Level  At rest  Oxygen Delivery Room Air/ 21 %   Brief Assessment:  Cardiac Regular   Respiratory clear BS   Gastrointestinal low abd tenderness   Lab Results: Thyroid:  03-May-14 04:03   Thyroid Stimulating Hormone 0.894 (0.45-4.50 (International Unit)  ----------------------- Pregnant patients have  different reference  ranges for TSH:  - - - - - - - - - -  Pregnant, first trimetser:  0.36 - 2.50 uIU/mL)  Routine Chem:  03-May-14 04:03   Glucose, Serum 74  BUN 14  Creatinine (comp) 0.70  Sodium, Serum 140  Potassium, Serum 3.9  Chloride, Serum  111  CO2, Serum 22  Calcium (Total), Serum  8.3  Anion Gap 7  Osmolality (calc) 279  eGFR (African American) >60  eGFR (Non-African American) >60 (eGFR values <36mL/min/1.73 m2 may be an indication of chronic kidney disease (CKD). Calculated eGFR is useful in patients with stable renal function. The eGFR calculation will not be reliable in acutely ill patients when serum creatinine is changing rapidly. It is not useful in  patients on dialysis. The eGFR calculation may not be applicable to patients at the low and high extremes of body sizes, pregnant women, and vegetarians.)  Routine Hem:  03-May-14 04:03   WBC (CBC) 5.0  RBC (CBC)  3.77  Hemoglobin (CBC)  11.7  Hematocrit (CBC)  33.9  Platelet Count (CBC) 231  MCV 90  MCH 31.0  MCHC 34.5  RDW 14.0  Neutrophil % 75.4  Lymphocyte % 15.1  Monocyte % 8.0  Eosinophil % 0.9  Basophil % 0.6  Neutrophil  # 3.8  Lymphocyte #  0.8  Monocyte # 0.4  Eosinophil # 0.0  Basophil # 0.0 (Result(s) reported on 07 Jun 2012 at 05:42AM.)   Assessment/Plan:  Assessment/Plan:  Assessment Diarrhea. Likely infectious'   Plan Continue cipro/flagyl. Hopefully, sxs will gradually improve. If not, repeat flex sign later this week. Thanks.   Electronic Signatures: Verdie Shire (MD)  (Signed [id number] 10:44)  Authored: Chief Complaint, VITAL SIGNS/ANCILLARY NOTES, Brief Assessment, Lab Results, Assessment/Plan   Last Updated: 04-May-14 10:44 by Verdie Shire (MD)

## 2014-05-28 NOTE — Consult Note (Signed)
PATIENT NAME:  Chloe GableBARBER, [name] MR#:  [id number] DATE OF BIRTH:  02-Jan-1923  DATE OF CONSULTATION:  06/07/2012  REFERRING PHYSICIAN:   CONSULTING PHYSICIAN:  Ezzard StandingPaul Y. Oliviagrace Crisanti, MD  REASON FOR REFERRAL: Abdominal pain, nausea, vomiting and diarrhea.   DESCRIPTION: The patient is an 81 year old white female with dementia, who is hard of hearing. She also has a history of coronary artery disease and hyperlipidemia. Because of her hearing loss and dementia, it was difficult to get a good history. However, family was available to provide some history.   Since last Sunday, the patient started developing some diarrhea, some nausea and vomiting. The family thought it was just a viral gastroenteritis. The patient was initially given some Imodium with some help. However, after a few days, the diarrhea came back associated with low abdominal pain and some further nausea and vomiting. The patient was then brought to the Emergency Room for evaluation. CT scan without contrast shows multiple air fluid levels in the small bowel and colon. Concern was that either she had ileus or partial bowel obstruction. As a result, the patient was brought in for further evaluation and treatment.   The patient no longer has any nausea and vomiting. However, the diarrhea persists. Stool was sent for C. difficile and came back negative.   PAST MEDICAL HISTORY: Notable for coronary artery disease and hypertension. She also has a history of anxiety, dementia and depression. Other history includes osteoporosis and hyperlipidemia. She has had recurrent UTIs in the past. There is a mention of pancreatic mass in the past as well.   ALLERGIES: CITALOPRAM, DOXYCYCLINE, PAROXETINE, LORAZEPAM, SULFA DRUGS AND TRAZODONE.  MEDICATIONS AT HOME: Includes: Lomotil, Tylenol, Imodium, loratadine daily, Maalox as needed, metoprolol daily, milk of magnesia, omeprazole, sertraline daily, temazepam at bedtime, tramadol p.r.n. and some inhalers.   SOCIAL  HISTORY: There is no smoking or alcohol history.   FAMILY HISTORY: Notable for diabetes and heart disease.   REVIEW OF SYSTEMS: There are no fevers or chills, but there is some fatigue and weakness. There is no weight loss or weight gain. There are no visual or hearing changes or rhinitis. No chest pain or palpitation. No coughing or shortness of breath. GI symptoms have been mentioned already with nausea, vomiting, diarrhea and abdominal pain. The rest of the review of symptoms is negative.    PHYSICAL EXAMINATION:  VITAL SIGNS: She is afebrile. Currently her vital signs are stable. HEENT: Normocephalic, atraumatic head. Pupils are equally reactive. Throat was clear.  NECK: Supple.  CARDIAC: Regular rhythm and rate without murmurs.  LUNGS: Clear bilaterally.  ABDOMEN: Normoactive bowel sounds, soft. There is tenderness in the lower abdomen, but more on the left lower quadrant area. There is no rebound or guarding. She has active bowel sounds. There is no hepatomegaly.  EXTREMITIES: No clubbing, cyanosis, or edema.  NEUROLOGIC: Nonfocal.  SKIN: Negative.  LABORATORIES: Again, CT shows some air fluid levels mainly in the small bowel plus also in the large intestine. Her initial labs show sodium of 134, potassium 3.3, chloride 105, CO2 25, creatinine was 0.77. Albumin is 3.3, but the rest of the liver enzymes are normal. White count is 9.6, hemoglobin 13.7, platelet count 267.   ASSESSMENT AND PLAN: This is a patient with nausea, vomiting, and diarrhea associated with some abdominal pain. She may have ileus or gastroenteritis, but I doubt that she actually has small bowel obstruction. It is possible that she could have diverticulitis since she has had left-sided diverticulosis by a  flexible sigmoidoscopy in November 2010. She also had diverticulosis back in 2007 as well. Other possible causes include infectious colitis. I agree with treating her with Cipro and Flagyl with IV fluids. Hopefully, the  symptoms will gradually improve over time. If it does not improve and she has persistent diarrhea and pain, then I may consider repeating a flexible sigmoidoscopy to evaluate the left side of the colon.   Thank you for the referral.    ____________________________ Ezzard Standing. Bluford Kaufmann, MD pyo:aw D: 06/08/2012 08:48:08 ET T: 06/08/2012 09:13:49 ET JOB#: [id number]  cc: Ezzard Standing. Bluford Kaufmann, MD, <Dictator> Ezzard Standing Liset Mcmonigle MD ELECTRONICALLY SIGNED 06/09/2012 13:19

## 2014-05-28 NOTE — Discharge Summary (Signed)
PATIENT NAME:  ROSELINE, [name] MR#:  [id number] DATE OF BIRTH:  05/13/1922  ADMITTING DIAGNOSIS: Pelvic fracture.   DISCHARGE DIAGNOSES:  1.  Cerebrovascular accident with infarct in the left coronary artery, likely thrombotic.  2.  Dysphagia due to cerebrovascular accident.  3.  Right-sided weakness due to cerebrovascular accident. 4.  AMS and fall due to cerebrovascular accident.  5.  Pelvic fracture after fall.  6.  Hyponatremia.  7.  Dehydration.  8.  Failure to thrive.  9.  Altered mental status due to stroke as well as possibly urinary tract infection.  10.  Urinary tract infection. Discharge cultures are pending.   DISCHARGE CONDITION: Stable.   DISCHARGE MEDICATIONS:  The patient is to continue: 1.  Sertraline 50 mg p.o. daily.  2.  Fluticasone nasal spray two sprays once at bedtime.  3.  Loratadine 10 mg p.o. daily.  4.  Omeprazole 10 mg p.o. daily.  5.  Temazepam 15 mg p.o. daily at bedtime.  6.  Ventolin HFA 2 puffs four times daily.  7.  Vitamin D3 1000 units one daily.  8.  Loperamide 2 mg after each loose bowel movement as needed but not more than 8 mg in 24 hours.  9.  Iophen 10/100 in 5 mL oral syrup, 10 mL every six hours as needed for cough.  10.  Maalox Advance Maximum Strength 400/400/40 in 5 mL oral suspension, 30 mL, orally up to 4 times a day as needed for indigestion and heartburn.  11.  Milk of magnesia 8% oral suspension 30 mL once daily at bedtime as needed.  12.  Acetaminophen 325 mg two tablets 3 times daily.  13.  RisaQuad oral capsule 1 capsule once daily.  14.  Acetaminophen oxycodone 325/5 mg one tablet every four hours as needed.  15.  Lisinopril 2.5 mg p.o. at bedtime.  16.  Atorvastatin 10 mg p.o. at bedtime.  17.  Aspirin 81 mg p.o. daily.  18.  Metoprolol tartrate 50 mg p.o. twice daily.  19.  Senna 1 tablet once daily as needed.  20.  Docusate sodium 100 mg p.o. twice daily.  21.  Ciprofloxacin 250 mg p.o. twice daily for three more days.   22.  Calcium with vitamin D 600/200 one tablet 3 times daily.   OXYGEN: None.   DIET: Two grams salt, low fat, low cholesterol, mechanical soft with nectar-thick liquids, strict aspiration precautions. Monitor for follow-up swallows and oral clearing, sit fully upright for all oral intake, medications in (Dictation Anomaly)puree, crushed  as able. Feeding assistance at all meals due to right upper extremity weakness. Moisten foods and alternate between the foods and liquids during meals when assisting with feeding.   ACTIVITY LIMITATIONS: As tolerated 2 to 7 times a week. OT as well as (Dictation Anomaly) SLP follow up are ordered.   FOLLOW-UP APPOINTMENT: With Dr. Randa Gonzalez in two days after discharge.    CONSULTANTS: Social work.   RADIOLOGIC STUDIES: Chest x-ray, portable, single view, September 16, 2012, showed no evidence of pneumonia or CHF. Elevation of the right hemidiaphragm persists and there is likely scarring above the hemidiaphragm. Follow-up PA and lateral chest x-rays with increased inspiration effort would be of value when the patient can tolerate the procedure according to radiologist.   Lumbar spine AP and lateral xray , September 15, 2012, showed vertebral compression of the body of L1 with loss of height anteriorly of 20%. There are degenerative disk changes at multiple lumbar levels. There is gentle scoliosis  with the convexity toward the right. There is diffuse osteopenia which limits evaluation of the spine otherwise.   A pelvic AP on September 15, 2012, showed no acute bony abnormality. The right hip is poorly positioned. Further investigation of right hip may be beneficial as there is clinical concern for fracture according to the radiologist.   Right hip complete x-ray, September 15, 2012, showed no acute bony abnormality of right hip.   CT scan of the right hip without contrast, September 15, 2012, showed possible fracture of femoral neck. MRI should be considered for further evaluation.  Comminuted fracture of inferior pubic ramus on the right.   MRI of brain without contrast, September 15, 2012, revealed acute nonhemorrhagic left carina radiata infarct.   MRI of femur, right, without contrast, September 16, 2012, showed no evidence of femoral neck fracture, fracture of the inferior pubic ramus which was seen on CT scan. Right hip abductor muscular partial tear was also seen.   Ultrasound of carotid arteries, bilateral, September 16, 2012, showed no evidence of hemodynamically significant carotid stenosis. There is tachycardia according to the radiologist.   Echocardiogram, September 16, 2012, showed left ventricular ejection fraction by visual estimation is 50% to 55%. Normal global left ventricular systolic function, mild mitral valve regurgitation, mild aortic regurgitation, mildly increased left ventricular posterior wall thickness, mild tricuspid regurgitation. No apparent vegetations.    HOSPITAL COURSE: The patient is an 81 year old Caucasian female with past medical history significant for history of multiple medical problems including hyperlipidemia, coronary artery disease, hypertension, osteoporosis, who presented to the hospital after a fall. Please refer to Dr. Rob HickmanGouru's admission note on September 15, 2012.   On arrival to the hospital, the patient's temperature was 97.5. Pulse was 96. Respiration rate was 18, blood pressure 181/84. Saturation was 94% on room air. Physical exam revealed pain in the right hip as well as pelvic region and decreased range of motion. CT scan of right lower extremity revealed a possible nondisplaced femoral neck fracture and a comminuted slightly displaced right inferior pubic ramus fracture as well as severe osteopenia.   The patient's EKG showed normal sinus rhythm at 81.7, normal axis. No acute ST or T changes were noted.   DIAGNOSTIC STUDIES: Lab data done in the Emergency Room, September 14, 2012, showed elevated glucose to 115, BUN of 19, sodium 133;  otherwise, BMP was unremarkable. The patient's liver enzymes revealed albumin level of 3.2; otherwise, unremarkable. TSH was normal at 1.30. The patient's white blood cell count was normal at 7.7. Hemoglobin was 12.3, platelet count 205.   Urinalysis showed yellow, hazy urine, negative for glucose, bilirubin or ketones. Specific gravity was 1.010, pH was 6.0 with 1+ blood, 25 mg/dL protein, negative for nitrites, 3+ leukocyte esterase, 8 red blood cells, 106 white blood cells, 1+ bacteria, no epithelial cells but white blood cell clumps as well as mucus were present.   The patient was admitted to the hospital for further evaluation with diagnosis of pelvic fracture as well as possible right hip fracture. The patient was evaluated and was noted to have right-sided weakness and some slurring of speech and difficulty holding things in her right hand. Further investigation of possible stroke was entertained, and the patient was noted to have acute stroke in the left corona radiata leaving her with right-sided weakness as well as some dysphagia.   The patient was evaluated by a physical therapist, occupational therapist, who worked with her and recommended rehabilitation placement. A speech therapist evaluated the patient  and recommended modified diet. The patient will be undergoing a modified barium swallowing study today, on September 18, 2012, after which her diet may be adjusted; meanwhile, she is to continue aspirin therapy for her stroke. Her lipid panel was checked and was found to be remarkable for LDL of 87, cholesterol 155, triglycerides 148, and HDL of 38. The patient was started on Lipitor at low dose.   ASSESSMENT AND PLAN:  1.  In regards to pelvic fracture, the patient was evaluated by a physical therapist as mentioned above and recommended rehabilitation placement. She is to continue pain medications as well as physical therapy in the facility.   2.  In regards to hyponatremia, the patient was  noted to be hyponatremic on arrival to the Emergency Room. Her sodium level was noted to be 133. With rehydration, the patient's sodium level was somewhat improved to 135 on September 18, 2012. It is recommended to follow the patient's oral intake and make decisions (Dictation Anomaly)about rechecking her sodium level and BMP if any suspicion for dehydration and follow her oral intake closely as the patient had very unpredictable oral intake ranging from 0% to 100% intermittently.   3.  In regards to altered mental status, this was felt to be due to stroke and possible urinary tract infection. The patient was noted to have light urinary tract infection. Urine cultures were taken; however, the patient's urine cultures are not reported at this time. The patient was initiated on antibiotic therapy. She is to continue antibiotics for three days to complete a three-day course.   4.  The patient was noted to have a high blood pressure. Initially blood pressure was not treated due to her stroke; however, later upon discharge, the decision was made to advance the patient's blood pressure medications as the patient's systolic blood pressure goes up as high as 185 early in the morning as well as in the middle of the night, so apart from metoprolol she was using in the past, which was advanced, we added also lisinopril to improve her blood pressure readings, especially at nighttime as well as morning time.   5.  For chronic medical problems such as coronary artery disease, anxiety, dementia, depression, the patient is to continue her outpatient management.   6.  For osteoporosis, she was added calcium as well as with vitamin D.   DISCHARGE CONDITION: Stable condition with the above-mentioned medications and follow-up.   TIME SPENT: 40 minutes.    ____________________________ Katharina Caper, MD rv:np D: 09/18/2012 13:56:41 ET T: 09/18/2012 15:13:50 ET JOB#: [id number]  cc: Katharina Caper, MD, <Dictator> Reola Mosher.  Chloe Lynn, MD  Katharina Caper MD ELECTRONICALLY SIGNED 09/24/2012 12:31

## 2014-05-28 NOTE — H&P (Signed)
PATIENT NAME:  [name], KUBE MR#:  [id number] DATE OF BIRTH:  [birth year]  DATE OF ADMISSION:  06/06/2012  PRIMARY CARE PHYSICIAN: Reola Mosher. Randa Lynn, MD  EMERGENCY DEPARTMENT REFERRING PHYSICIAN: Doralee Albino. Ladona Ridgel, MD  CHIEF COMPLAINT: Bilateral groin pain, nausea, vomiting, diarrhea.   HISTORY OF PRESENT ILLNESS: The patient is an 81 year old white female with history of hyperlipidemia, coronary artery disease, hypertension, has some dementia, very hard of hearing, who lives at [facility], who reports that she developed diarrhea since last Sunday. It was very liquidy and had multiple bowel movements on Sunday and then diarrhea went away. Then subsequently, patient since yesterday started having recurrence of her diarrhea and started having pain in bilateral lower abdomen and groin region. The patient came to the ED and had a CT scan, but was not given any p.o. contrast, which showed multiple small bowel air-fluid levels with air seen within the small bowel and colon. They stated that it could be ileus versus enteritis versus partial small bowel obstruction. We are asked to admit the patient. The patient reports that she has not been on any recent antibiotics. There are other residents with similar type of presentation. The patient otherwise denies any chest pain. No shortness of breath. No hematochezia. No hematemesis.   PAST MEDICAL HISTORY:  1.  Hyperlipidemia.  2.  History of medical coronary artery disease.  3.  Hypertension.  4.  Osteoporosis with previous history of compression fracture.  5.  Anxiety.  6.  Dementia.  7.  Depression.  8.  Carpal tunnel syndrome.  9.  History of diverticulosis and diverticulitis.  10.  Chronic pain syndrome.  11.  History of urinary retention in the past.  12.  History of recurrent UTIs.  13.  History of pancreatic mass.  14.  Reactive airway disease.  ALLERGIES: TO MULTIPLE MEDICATIONS INCLUDING CITALOPRAM, DOXYCYCLINE, LORAZEPAM, PAROXETINE, SULFA  DRUGS AND TRAZODONE.   CURRENT OUTPATIENT MEDICATIONS: She is on Tylenol 325 q.6 p.r.n., atropine and diphenoxylate 1 tab 4 times a day as needed for diarrhea, fluticasone 2 sprays at bedtime, Iophen 10/100 two tablespoons q.6 as needed for cough, loperamide 2 mg once with loose stools, loratadine 10 daily, Maalox 30 mL 4 times a day as needed, metoprolol tartrate 50 daily, Mapap 500 one tab p.o. q.4 p.r.n. for fever or headache, milk of magnesia 30 mL daily, omeprazole 20 daily, sertraline 50 one tab p.o. daily, temazepam 15 at bedtime, tramadol 50 q.6 p.r.n., Ventolin 2 puffs 4 times a day, vitamin D3 one tab p.o. daily.   SOCIAL HISTORY: The patient lives at [facility]. Denies smoking, alcohol or drug use.   FAMILY HISTORY: History of heart disease and diabetes in her parents.   REVIEW OF SYSTEMS:   CONSTITUTIONAL: Denies any fevers. Complains of fatigue, weakness, bilateral abdominal/lower groin pain. No weight loss. No weight gain.  EYES: No blurred or double vision. No redness. No inflammation.  EARS, NOSE, THROAT: No tinnitus. No ear pain. Has significant hearing loss. Denies any seasonal or year-round allergies. No difficulty swallowing.  RESPIRATORY: Has intermittent cough. No wheezing. No hemoptysis.  CARDIOVASCULAR: Denies any chest pain, orthopnea, edema or arrhythmia.  GASTROINTESTINAL: Complains of nausea, vomiting, diarrhea, abdominal pain. No hematemesis. No melena. No rectal bleeding.  GENITOURINARY: Denies any dysuria, hematuria, renal calculus or frequency.  ENDOCRINE: Denies any polyuria, nocturia. HEMATOLOGIC AND LYMPHATIC: Denies anemia, easy bruisability or bleeding.  SKIN: No acne. No rash. No changes in mole, hair or skin.  MUSCULOSKELETAL: Denies any  pain in neck, back or shoulder.  NEUROLOGICAL: No numbness. No TIA. No seizures.  PSYCHIATRIC: No anxiety. No insomnia. No ADD.   PHYSICAL EXAMINATION:  VITAL SIGNS: Temperature 97.2, pulse 105, respirations  16, blood pressure 128/64, O2 sat 94%.  GENERAL: The patient is a frail elderly female, currently not in any acute distress.  HEENT: Head atraumatic, normocephalic. Pupils equally round, reactive to light and accommodation. There is no conjunctival pallor. No scleral icterus. Nasal exam shows no drainage or ulceration. Oropharynx is a little dry.  EARS: no external lesions, no drainage NECK: No thyromegaly. No carotid bruits.  CARDIOVASCULAR: Regular rate and rhythm. No murmurs, rubs, clicks or gallops. PMI is not displaced.  LUNGS: Clear to auscultation bilaterally without any rales, rhonchi or wheezing.  ABDOMEN: She has tenderness in bilateral lower quadrants. No guarding. No rebound. Positive bowel sounds x 4. No hepatosplenomegaly.  GU: deffered EXTREMITIES: No clubbing, cyanosis or edema.  SKIN: No rashes.  MUSCULOSKELETAL: There is no erythema or swelling.  NEUROLOGICAL: Cranial nerves II through XII grossly intact. Strength 5 out of 5 in all 4 extremities.  PSYCHIATRIC: Not anxious or depressed.  LYMPHATIC: No lymph nodes palpable.   LABORATORY AND RADIOLOGICAL DATA: In the ED, CT scan of the abdomen shows small bowel air-fluid levels with air seen within the small bowel and colon. This may reflect ileus versus enteritis versus partial small bowel obstruction. Glucose 93, BUN 22, creatinine 0.77, sodium 134, potassium 3.3, chloride 105, CO2 of 25, calcium 9.1. LFTs are normal except albumin of 3.3. WBC 9.6, hemoglobin 13.7, platelet count 267. Lipase 73. Troponin less than 0.02.   ASSESSMENT AND PLAN: The patient is an 81 year old white female who presents with diarrhea, abdominal pain. 1.  Diarrhea, abdominal pain: Possibly due to enteritis. Her CT scan is very nonspecific. At this time, we need to treat her with empiric Cipro and Flagyl. Will get gastroenterology to see the patient. Give her intravenous fluids. Check stool for Clostridium difficile and stool comprehensive culture.  2.   Elevated troponin: Likely due to possible demand ischemia. Will follow her cardiac enzymes.  3.  Coronary artery disease: Will place her on aspirin 81 mg daily.  4.  Hypokalemia: Will replace her potassium.  5.  Gastroesophageal reflux disease. Will continue omeprazole.  6.  Depression: Will continue sertraline as taking at home.   CODE STATUS: The patient is unable to understand DNR status at this time. By default, will continue her Full Code. No family around to confirm this.   TIME SPENT: 50 minutes.   ____________________________ Lacie ScottsShreyang H. Allena KatzPatel, MD shp:jm D: 06/06/2012 14:13:10 ET T: 06/06/2012 15:26:41 ET JOB#: [id number]  cc: Torsha Lemus H. Allena KatzPatel, MD, <Dictator> Charise CarwinSHREYANG H Lumina Gitto MD ELECTRONICALLY SIGNED 06/14/2012 14:32

## 2014-05-28 NOTE — Discharge Summary (Signed)
PATIENT NAME:  [name], [name] MR#:  [id number] DATE OF BIRTH:  Dec 22, 1922  DATE OF ADMISSION:  06/06/2012 DATE OF DISCHARGE:  06/09/2012  PRESENTING COMPLAINT: Abdominal pain with diarrhea.  DISCHARGE DIAGNOSES: 1.  Suspected enteritis, slow improvement.  2.  Impaired hearing.  3.  Hyperlipidemia.  4.  Depression.  5.  Possible suspected early dementia.   CONDITION ON DISCHARGE: Fair.   CODE STATUS: Full code.   MEDICATIONS: 1.  Sertraline 50 mg daily.  2.  Fluticasone nasal spray 2 sprays daily at bedtime.  3.  Loratadine 10 mg daily.  4.  Metoprolol 50 mg daily.  5.  Omeprazole 20 mg delayed release 1 capsule daily.  6,  Temazepam 15 mg 1 capsule once a day at bedtime.  7.  Ventolin HFA 2 puffs 4 times a day.  8.  Vitamin D3 1000 international units daily.  9.  Loperamide 2 mg as needed.  10.  Iophen 10/100, 10 mL every 6 hours as needed for cough.  11.  Maalox 30 mL orally up to 4 times a day as needed.  12.  Mapap 500 mg tablet 1 tablet every 4 hours as needed.  13.  Milk of magnesia 30 mL daily at bedtime.  14.  Tramadol 50 mg every 6 hours as needed.  15.  Flagyl 500 mg b.i.d.  16.  Cipro 200 mg b.i.d.   DIET: Mechanical soft diet.   FOLLOWUP:  1.  Follow up with Dr. Bluford Kaufmannh in 1 to 2 weeks.  2. Follow up with Dr. Randa LynnLamb in 1 to 2 weeks.   LABORATORY AND DIAGNOSTIC DATA:  Abdominal x-ray nonspecific gas pattern. Clostridium difficile negative. Stool cultures no pathogen identified.  White count is 5, hemoglobin and hematocrit is 11.7 and 33.9.  Basic metabolic panel within normal limits. TSH is 0.894. CT of the abdomen and pelvis shows multiple small bowel air fluid levels seen within the small bowel and colon.  This is ileus versus enteritis. Cardiac enzymes negative.   CONSULTATIONS:  Gastroenterology consultation with Dr. Bluford Kaufmannh.   BRIEF SUMMARY OF HOSPITAL COURSE:  This patient is an 81 year old Caucasian female who comes in from assisted living who has impaired hearing,  history of on and off diarrhea, who comes in with abdominal pain and diarrhea. She was admitted with:  1.  Acute enteritis. The patient presented with some mild diffuse abdominal pain and diarrhea. She had a CT scan that was very nonspecific. She was started on empiric Cipro and Flagyl, which is suggested by gastroenterology, Dr. Bluford Kaufmannh. She did not have any fever or abdominal pain or abnormal white count. She did not have any fever or abdominal pain.  She improved slowly. White count was normal.  Her Clostridium difficile remained negative. Stool cultures were negative as well. She appeared to be a somewhat poor historian, hard of hearing and unable to detail any more about the abdominal pain. Her diet was advanced to regular mechanical soft prior to discharge. She will follow up with GI as outpatient.  2.  Coronary artery disease, on aspirin. Her elevated troponin was likely demand ischemia. The patient did not have any chest pain.  3.  Gastroesophageal reflux disease.  4.  Depression.  Continue on Sertraline.  The patient ambulated well with PT.  She will be [facility] to [facility]. Follow up with Dr. Bluford Kaufmannh as an outpatient.   TIME SPENT: 40 minutes.   ____________________________ Wylie HailSona A. Allena KatzPatel, MD sap:ct D: 06/10/2012 06:55:29 ET T: 06/10/2012 07:52:07 ET  JOB#: [id number]  cc: Icker Swigert A. Allena Katz, MD, <Dictator> Ezzard Standing. Bluford Kaufmann, MD Reola Mosher. Randa Lynn, MD Willow Ora MD ELECTRONICALLY SIGNED 06/24/2012 15:21

## 2014-05-28 NOTE — Discharge Summary (Signed)
PATIENT NAME:  [name], [name] MR#:  [id number] DATE OF BIRTH:  1922-10-09  DATE OF ADMISSION:  01/23/2013 DATE OF DISCHARGE:  01/27/2013  PRIMARY CARE PHYSICIAN:  Dr. Larwance SachsBabaoff  DISCHARGE DIAGNOSES: 1.  Acute encephalopathy, secondary to urinary tract infection.  2.  Hyponatremia.  3.  Hypertension.  4.  Coronary artery disease.  5.  Dementia.   CONDITION: Stable.  CODE STATUS: DO NOT RESUSCITATE.  HOME MEDICATIONS: Please refer to the [facility] physician discharge instruction medication reconciliation list.   DIET: Low-sodium, low-fat, low-cholesterol diet.   ACTIVITY: As tolerated.  FOLLOW-UP CARE:  Follow with PCP within 1 to 2 weeks and also follow up BMP with PCP.   REASON FOR ADMISSION: Altered mental status.   HOSPITAL COURSE: The patient is an 81 year old Caucasian female with a history of hypertension, dementia, hyperlipidemia, was [facility] to ED due to worsening confusion from baseline dementia. The patient was recently diagnosed with UTI, was treated with Cipro for 2 days without improvement. The patient was noted to have a UTI and hypertension with systolic blood pressure more than 200.  For detailed history and physical examination, please refer to the admission note dictated by Dr. Clint GuyHower.  LABORATORY DATA:  On admission date showed sodium 128, potassium 4.7, chloride 94, BUN 17, creatinine 0.52, glucose 108, magnesium 1.5. CBC 5.8, hemoglobin 31.2.  Urinalysis showed WBC 27, RBC 1.  1.  Acute encephalopathy secondary to urinary tract infection. After admission, the patient has been treated with Rocephin. The patient's altered mental status has been improving back to her baseline.  2.  The patient has no fever or chills. Vital signs are stable.  Physical examination: She is unremarkable.  3.  For hyponatremia, the patient has been treated with normal saline IV. Sodium increased to 130.  4.  For hypertension, the patient has been treated with  lisinopril and Lopressor with p.r.n. IV hydralazine. Blood pressure fluctuates, but the last blood pressure is 148/69, pulse 81.  5.  For coronary artery disease, the patient has been treated with aspirin.  The patient is also treated with physical therapy. The patient is clinically stable, will be discharged back to nursing home today. Discussed the patient's discharge plan with the patient, nurse, case Production designer, theatre/television/filmmanager and Child psychotherapistsocial worker.   TIME SPENT: About 35 minutes.    ____________________________ Shaune PollackQing Athira Janowicz, MD qc:ce D: 01/27/2013 15:17:16 ET T: 01/27/2013 15:36:15 ET JOB#: [id number]  cc: Shaune PollackQing Ken Bonn, MD, <Dictator> Shaune PollackQING Jevonte Clanton MD ELECTRONICALLY SIGNED 01/27/2013 17:08

## 2014-05-28 NOTE — H&P (Signed)
PATIENT NAME:  Chloe GableBARBER, Brayley C MR#:  [id number] DATE OF BIRTH:  Jan 15, 1923  DATE OF ADMISSION:  09/15/2012  PRIMARY CARE PHYSICIAN:  Alonna BucklerAndrew Lamb, MD  REFERRING PHYSICIAN:  Jene Everyobert Kinner, MD  CHIEF COMPLAINT: Fall.   HISTORY OF PRESENT ILLNESS: The patient is an 81 year old Caucasian female who is residing at [facility] with multiple medical problems who suffers from dementia and hard of hearing and is sent over after she sustained a fall. Initial x-ray has revealed questionable right hip fracture. CT of hip is done which has revealed comminuted inferior pubic ramus fracture on the right side and possible nondisplaced transcervical right femoral neck fracture; consider MRI. Hospitalist team is called to admit the patient for pain management and to get a MRI done in the morning. No family members are at bedside. The patient is demented, very hard of hearing, but complaining of pain in the hip.  It is 8 out of 10. She also reported that she felt dizzy prior to the fall. Denies any chest pain or abdominal pain. No other complaints.   PAST MEDICAL HISTORY: Hyperlipidemia, coronary artery disease, hypertension, osteoporosis, anxiety, dementia, depression, carpal tunnel syndrome, diverticulosis without diverticulitis, chronic pain syndrome, recurrent UTIs, history of pancreatic mass, history of urinary retention in the past, reactive airway disease.  PAST SURGICAL HISTORY: None.  ALLERGIES: ESCITALOPRAM, DOXYCYCLINE, LORAZEPAM, PAROXETINE, SULFUR, TRAZODONE.   PSYCHOSOCIAL HISTORY: Residing in [facility].  Denies smoking or alcohol.   FAMILY HISTORY: Heart disease and diabetes in her parents.  REVIEW OF SYSTEMS:  CONSTITUTIONAL: Denies any fever. Complains of fatigue. No weight loss.  EYES: Denies blurry vision. No redness.  ENT: No epistaxis or discharge.  RESPIRATORY: Denies any coughing, wheezing.  CARDIOVASCULAR: No chest pain or shortness of breath.  GASTROENTEROLOGY:  Denies nausea,  vomiting, diarrhea.  GENITOURINARY: No dysuria or hematuria.  ENDOCRINE: No polyuria, nocturia, thyroid problems.  HEMATOLOGIC AND LYMPHATIC: No anemia.  Easy bruisability with multiple bruising on the extremities.  MUSCULOSKELETAL: Complaining of pelvic pain in the right side, hip pain on the right side. No pain in the neck or shoulders.  NEUROLOGIC: Denies any history of TIA or seizures.  PSYCHIATRIC: History of depression is present, but no ADD or OCD.   PHYSICAL EXAMINATION: VITAL SIGNS: Temperature 97.5, pulse 96, respirations 18, blood pressure 181/84, pulse ox 94%.  GENERAL APPEARANCE: Not in acute distress. Moderately built and nourished.  HEENT: Normocephalic. Pupils are equal and reacting light and accommodation. No scleral icterus. No sinus tenderness. No postnasal drip.  NECK: Supple. No JVD. No thyromegaly. No lymphadenopathy.  LUNGS: Clear to auscultation bilaterally. No accessory muscle use. No anterior chest wall tenderness on palpation.  HEART:  Regular rate and rhythm. Positive murmur.  ABDOMEN:  Soft. Bowel sounds are positive in all 4 quadrants. Nontender, nondistended. No hepatosplenomegaly. NEUROLOGIC:  Awake and alert, oriented x 2. Demented. Sensory is intact.  MUSCULOSKELETAL: Complaining of right hip and pelvic pain with decreased mobility.  EXTREMITIES: No edema. No cyanosis. No clubbing. Reflexes are 2+.  SKIN: Warm to touch. Normal turgor. No rashes. No lesions.  LABS AND IMAGING STUDIES:  CAT scan of the right lower extremity revealed possible subtle nondisplaced transcervical femoral neck fracture. Consider MRI.  Comminuted slightly displaced right inferior pubic ramus fracture. Severe osteopenia limits ability to detect non-displaced fractures.   A 12-lead EKG has revealed normal sinus rhythm at 81 beats per minute, normal PR and QRS interval.   Glucose 115, BUN 19, creatinine 0.99, sodium 133, potassium 4.1,  chloride 100, CO2 27, GFR 59, anion gap 6, serum  osmolality 270, calcium 9.1. WBC 7.7, hemoglobin 12.3, hematocrit 35.1 and platelets 205. LFTs normal, except albumin which is low at 3.2.   ASSESSMENT AND PLAN: An 81 year old female sent over from [facility] after she sustained a fall. Will be admitted with the following assessment and plan:  1.  Fall associated with dizziness. She has right inferior pubic ramus comminuted fracture.  Will admit the patient to telemetry.  Will provide IV fluids and check orthostatics. PT evaluation after ruling out right femoral neck fracture. Get MRI in the a.m.  2.  Pain management with morphine IV as needed basis.  3.  Possible nondisplaced femoral neck fracture. We will obtain MRI in the a.m. and pain management with morphine.  4.  Hypertension. Blood pressure is uncontrolled. It could be from pain. Will resume her antihypertensive, and we will provide her pain management.  5.  Hypertension. Resume blood pressure medications. Titrate on as needed basis.  6.  Chronic history of osteoporosis. 7.  History of dementia. We will provide GI and DVT prophylaxis. No family members are currently available at bedside.  Attempted to call [facility] to find out the patient's code status and I was unsuccessful to reach the nurse taking care of this patient. According to the old medical records, the patient was FULL CODE so we will continue FULL CODE status until code status is determined.  TOTAL TIME SPENT ON ADMISSION: 50 minutes.  ____________________________ Ramonita Lab, MD ag:sb D: 09/15/2012 05:47:10 ET T: 09/15/2012 07:01:34 ET JOB#: [id number]  cc: Ramonita Lab, MD, <Dictator> Ramonita Lab MD ELECTRONICALLY SIGNED 09/18/2012 7:56

## 2014-05-28 NOTE — Consult Note (Signed)
Pt seen and examined. Full consult to follow. Pt HOH and has dementia. C/O nausea/vomiting/diarrha plus low abd pain. No response to imodium at home. Nausea/vomiting better. No bleeding. Had left sided diverticulosis plus small sigmoid polyps in 2007. Only had left sided tics in 2010. Stool neg for c.diff. CT without contrast suggests either ileus vs pSBO. Agree with treating patient with cipro/flagyl for poss diverticulitis or infectious colitis. If no signif improvement over next few days, will consider repeating flex sigmoidoscopy next week. Thanks.  Electronic Signatures: Lutricia Feilh, Allanah Mcfarland (MD)  (Signed on 03-May-14 11:14)  Authored  Last Updated: 03-May-14 11:14 by Lutricia Feilh, Garielle Mroz (MD)

## 2014-05-28 NOTE — Consult Note (Signed)
Chief Complaint:  Subjective/Chief Complaint Low abd tenderness. No obvious diarrhea.   VITAL SIGNS/ANCILLARY NOTES: **Vital Signs.:   05-May-14 04:00  Vital Signs Type Routine  Temperature Temperature (F) 97.4  Celsius 36.3  Temperature Source oral  Pulse Pulse 53  Respirations Respirations 19  Systolic BP Systolic BP 146  Diastolic BP (mmHg) Diastolic BP (mmHg) 78  Mean BP 100  Pulse Ox % Pulse Ox % 97  Pulse Ox Activity Level  At rest  Oxygen Delivery Room Air/ 21 %   Brief Assessment:  Cardiac Regular   Respiratory clear BS   Gastrointestinal low abd tenderness   Lab Results: Routine Micro:  03-May-14 07:18   Micro Text Report WBCS, STOOL   COMMENT                   0-5/OIL IMMERSION FIELD WBC'S SEEN   COMMENT                   0-5/OIL IMMERSION FIELD RBC'S SEEN   COMMENT                   100% EOSINOPHILS   ANTIBIOTIC                        Comment .1. 0-5/OIL IMMERSION FIELD WBC'S SEEN  Comment .2. 0-5/OIL IMMERSION FIELD RBC'S SEEN  Comment .3. 100% EOSINOPHILS  Result(s) reported on 07 Jun 2012 at 02:16PM.  Culture Comment REPEATING ID FOR POSSIBLE PATHOGEN  Culture Comment . NO PATHOGENIC E.COLI DETECTED  Culture Comment    . NO CAMPYLOBACTER ANTIGEN DETECTED  Result(s) reported on 09 Jun 2012 at 10:03AM.  Comment  1. NEGATIVE-CLOS.DIFFICILE TOXIN NOT DETECTED BY PCR ---------------------------------- Test procedure integrates sample purification, nucleic acid amplification, and detection of the target Clostridium difficile sequence in simple or complex samplesusing real-time PCR and RT-PCR assays.   Assessment/Plan:  Assessment/Plan:  Assessment Abd pain. On cipro/flagyl   Plan If no relief soon, will plan flex sig, perhaps [place] Wed [place] [facility] [place]. Thanks.   Electronic Signatures: Lutricia Feilh, Krystie Leiter (MD)  (Signed [phone]-May-14 13:04)  Authored: Chief Complaint, VITAL SIGNS/ANCILLARY NOTES, Brief Assessment, Lab Results, Assessment/Plan   Last  Updated: 05-May-14 13:04 by Lutricia Feilh, Chey Cho (MD)

## 2014-05-28 NOTE — H&P (Signed)
PATIENT NAME:  [name], [name] MR#:  [id number] DATE OF BIRTH:  [birth year]  DATE OF ADMISSION:  01/23/2013  REFERRING PHYSICIAN: Dr. York Cerise  PRIMARY CARE PHYSICIAN: Dr. Larwance Sachs  CHIEF COMPLAINT: Altered mental status.   HISTORY OF PRESENT ILLNESS: This is an 81 year old Caucasian female who is a [employer] resident, with past medical history of dementia, hypertension, hyperlipidemia, presenting with altered mental status. Apparently, had a 2-day duration of altered mental status described as worsening confusion from baseline dementia. She was recently diagnosed with a UTI, started on Cipro about 2 days ago, with no improvement of symptoms. Decision made to transfer her to [facility] for further workup and evaluation. The patient herself is unable to provide any further information. She is only able to state that she has pain all over, arms, legs, head and eyes, which is chronic for her. In the Emergency Department, she was found to have a UTI as well as being markedly hypertensive with systolic blood pressure greater than 200.   REVIEW OF SYSTEMS: Unable to obtain secondary to patient's baseline mental status and medical condition.   PAST MEDICAL HISTORY: Hyperlipidemia, coronary artery disease, hypertension, osteoarthritis, dementia, depression, not otherwise specified.   SOCIAL HISTORY:  Current [employer] resident. Denies any alcohol, tobacco, or drug usage, per documentation.    FAMILY HISTORY:  Positive for diabetes as well as coronary artery disease.   ALLERGIES: CITALOPRAM, DOXYCYCLINE, LORAZEPAM, PAROXETINE, SULFA DRUGS, and TRAZODONE.   HOME MEDICATIONS: Include Cipro 250 mg p.o. b.i.d. for a 5-day total duration, atorvastatin 10 mg p.o. daily, aspirin 81 mg p.o. daily, lisinopril 10 mg p.o. daily, loratadine 10 mg p.o. daily, Prilosec 20 mg p.o. daily, RisaQuad capsules 1 capsule p.o. daily, sertraline 50 mg p.o. daily, vitamin D3, 1000 international units p.o. daily, Colace 100  mg p.o. b.i.d., Lopressor 50 mg p.o. b.i.d., acetaminophen 325 mg p.o. 2 tablets 3 times daily, calcium 600 plus vitamin D 200 international units p.o. 3 times daily with meals, Flonase 50 mcg spray 2 sprays each nostril at bedtime, Ambien 5 mg p.o. at bedtime, triamcinolone 0.1% cream apply to arm twice daily, lisinopril 10 mg p.o. daily, antacid, her Mylanta liquid 30 mL q. 6 hours as needed for indigestion, Bacitracin 500 units per gram topical solution, apply to wound on forearm, hydrocodone/acetaminophen 5/325 mg p.o. q. 6 hours as needed for pain, loperamide 2 mg p.o. daily as needed for diarrhea, Zofran 4 mg p.o. q. 8 hours as needed for nausea/vomiting, ProAir 90 mcg inhalation 2 puffs 4 times a day as needed for wheezing, senna laxative 8.6 mg p.o. daily as needed for constipation   PHYSICAL EXAMINATION: VITAL SIGNS: Temperature 97.3, heart rate 86, respirations 20, blood pressure 213/96, trending down to 156/87 at its lowest, saturating 97% on room air. Weight 49.9 kg, BMI 20.1.  GENERAL:  A frail, chronically ill-appearing, Caucasian female, currently in no acute distress.  HEAD: Normocephalic, atraumatic.  EYES: Pupils equal, round, reactive to light. Extraocular muscles intact. No scleral icterus.  MOUTH: Dry mucosal membranes. Dentition intact. No abscess noted.  EARS, NOSE, THROAT:  Throat clear, without exudates. No external lesions.  NECK: Supple. No thyromegaly. No nodules. No JVD.  PULMONARY: Clear to auscultation bilaterally. No wheezes or rhonchi. No use of accessory muscles. Good respiratory effort.  CHEST: Nontender to palpation.  CARDIOVASCULAR: S1, S2. Regular rate and rhythm. A 2/6 systolic ejection murmur. No edema. Pedal pulses 2+ bilaterally.  GASTROINTESTINAL: Soft, nontender, nondistended. No masses. Positive bowel sounds. No hepatosplenomegaly.  MUSCULOSKELETAL: No swelling, clubbing, or edema. Range of motion full in all extremities.  NEUROLOGIC: Cranial nerves II  through XII intact. No gross focal neurological deficits. Sensation intact. Reflexes intact.  SKIN: Multiple areas of ecchymosis as well as skin tears. However, no cyanosis. Skin is warm and dry. Skin turgor is poor.  PSYCHIATRIC: Mood and affect:  She is confused and agitated. She is alert and oriented only to her name. She does not know location or date. Insight and judgment are poor.   LABORATORY DATA: Sodium 128, potassium 4.7, chloride 94, bicarb 28, BUN 17, creatinine 0.52, glucose 108, magnesium 1.5. LFTs within normal limits. WBC 5.8, hemoglobin 13.2, platelets of 299. Urinalysis: WBCs 27, RBCs 1, leukocyte esterase trace.  ASSESSMENT AND PLAN: An 81 year old Caucasian female who is a [employer] [facility] [place], with history of dementia, presenting with altered mental status.   1.  Acute encephalopathy secondary to urinary tract infection. Urine culture has been obtained. Ceftriaxone for antibiotic coverage, as she was previously on p.o. Cipro, without any affect. Will follow culture data. Avoid excessively sedating agents, as possible, will hold her Ambien.   2. Hyponatremia, with known decreased p.o. intake for 2-day duration. Provide IV fluid hydration with normal saline at 75 mL an hour. Follow BMP for sodium trend.   3.  Hypertension. Add p.r.n. hydralazine 10 mg IV for systolic blood pressure greater than 180 or diastolic greater than 100, as well as continue her home dosage of lisinopril and Lopressor, which were both recently changed.  4.  Chronic pain. Continue with hydrocodone, acetaminophen as needed.   5.  Venous thrombosis prophylaxis with heparin subcu.   6.  The patient is DO NOT RESUSCITATE, as discussed with her healthcare power of attorney, Sonny MastersBarbara Clark, [phone].   TIME SPENT:  45 minutes.    ____________________________ Cletis Athensavid K. Hower, MD dkh:mr D: 01/23/2013 20:52:51 ET T: 01/23/2013 21:48:10 ET JOB#: [id number]  cc: Cletis Athensavid K. Hower, MD,  <Dictator> DAVID Synetta ShadowK HOWER MD ELECTRONICALLY SIGNED 01/24/2013 2:46

## 2014-05-29 NOTE — Consult Note (Signed)
Brief Consult Note: Diagnosis: valgus impacted femoral neck fracture.   Comments: After reviewing CT scan, fracture appears impacted and thus amenable to cannulated screw fixation as opposed to hemiarthroplasty. This was discussed with the family and they agreed to proceed with screw fixation.  Electronic Signatures: Danelle EarthlyEckel, Tobin T (MD)  (Signed 04-Jan-15 19:26)  Authored: Brief Consult Note   Last Updated: 04-Jan-15 19:26 by Danelle EarthlyEckel, Tobin T (MD)

## 2014-05-29 NOTE — H&P (Signed)
PATIENT NAME:  Chloe GableBARBER, Naysha C MR#:  [id number] DATE OF BIRTH:  [birth year]  DATE OF ADMISSION:  02/08/2013  REFERRING PHYSICIAN: Dr. Fanny BienQuale.   PRIMARY CARE PHYSICIAN:    CHIEF COMPLAINT: A fall.   HISTORY OF PRESENT ILLNESS: The patient is an 81 year old female with history of dementia and depression. Recent hospitalization for encephalopathy secondary to urinary tract infection and hyponatremia. Of note, she also has history of stroke in August, resulting with right-sided weakness. The patient after the stroke had gone to rehab per family for multiple weeks. Initially was wheelchair-bound and now walks with a walker. She woke up this morning about 6:00 a.m. and wanted to go to the bathroom. She reached for her walker but could not grip it and fell resulting in immediate pain in the left side of her hip. She was brought into the hospital and was noted to have subcapital left femoral fracture and hospitalist services were contacted for further evaluation and management. The case has been discussed with orthopedics and she apparently will be taken to the OR later today. The patient has no chest pain, but is a rather poor historian and very hard of hearing and communication with her is rather difficult. She has her brother and her sister-in-law in the room who states they are her health care power of attorney. She has no other complaints per se besides hip pain at this point.   PAST MEDICAL HISTORY: Hyperlipidemia, history of coronary artery disease and hypertension, recent stroke with right-sided weakness, osteoporosis, anxiety, dementia, depression, carpal tunnel syndrome, history of diverticulosis, chronic pain, recurrent UTIs, history of a pancreatic mass, reactive airway disease, history of fall and pelvic fracture recently.   ALLERGIES: CIPRO, DOXYCYCLINE, LORAZEPAM, PAROXETINE,  SULFA DRUGS AND TRAZODONE.   SOCIAL HISTORY: Currently lives at the [facility]. No alcohol or smoking.   FAMILY HISTORY: Heart  disease, diabetes in her parents.   REVIEW OF SYSTEMS: Unable to fully obtain given the communication issue and the patient's dementia, but from what I can get the patient has active pain in the hip on the left. She had some dizziness prior which is improved. The patient does have weakness on the right side which is chronic. The patient has no active chest pains. No abdominal pain. She endorses some dysuria.   PHYSICAL EXAMINATION: VITAL SIGNS: Temperature on arrival 97.6, pulse rate 82, respiratory rate 22, blood pressure 184/80, oxygen saturation 94% on room air.  GENERAL: The patient is an elderly female lying in bed and mild to moderate pain.  HEENT: Normocephalic, atraumatic. Pupils are equal and reactive. Moist mucous membranes.  NECK: Supple. No thyroid tenderness. No cervical lymphadenopathy.  CARDIOVASCULAR: S1, S2, tachycardic. No significant murmurs appreciated.  LUNGS: Poor effort but appears to be clear anteriorly.  ABDOMEN: Soft, nontender, nondistended. Positive bowel sounds.  EXTREMITIES: No pitting edema. NEUROLOGICAL: Cranial nerves II through XII appears to be intact,  strength of bilateral upper extremities 5/5, did not test lower extremities secondary to pain in the hip.  PSYCHIATRIC: She is awake, alert, oriented x 1.  SKIN: No obvious rashes.   IMAGING: Left hip shows subcapital left femoral fracture. X-ray of the chest, one view, shows no acute chest findings. There is chronic elevation of the right hemidiaphragm.   Sodium 131, potassium 3.8. Sodium appears to be chronically on the lower side. It was 130 on December 23rd and 129 on December 22nd. BUN 15, creatinine 0.66, white count of 4.1, hemoglobin 12.9, platelets 289. On 12/21 the patient was  noted to have enterococcus VRE urinary tract infection, but it was only 50,000 CFUs. EKG shows sinus tach, rate of 111, there is Q waves in V1, V2, V3 as prior. No acute ST elevations. There is slight T wave inversions in aVL which  appears to be perhaps new and some ST depressions perhaps in V6., possibly in V5 but there is some motion artifact.   ASSESSMENT AND PLAN: We have an 81 year old female with multiple comorbidities including recent stroke with right-sided weakness living at [facility]. History of hyperlipidemia, coronary artery disease, hypertension, and dementia, status post a fall which appears to be mechanical as she tried to reach for her walker, was unable to, fell down resulting in hip fracture. The patient has no active chest pains. Of note, there is also an echocardiogram from around August showing ejection fraction of 50% to 55% or so without significant valvular abnormalities. She does have an abnormal EKG; however, given the hip fracture and the fact that she will be taken to the OR later today, I do not know what could be done for her. Furthermore, she has no active pains in the chest. I have discussed the case with Dr. Lady Gary, who recommended continuation of the beta blocker and statin which I have already done. She is going to be a moderate to high risk patient for surgery; however, given the fracture and significant pain the alternative would be pain control and bedbound state, which would not be optimal. I consulted cardiology, but I suspect that as Dr. Lady Gary said, we would just have to continue the statin and beta blocker at this point. We will await for Dr. Lady Gary for full consult,  but I suspect that we would not do any further testing given the recent echocardiogram. Would defer to Ortho for pain control and deep vein thrombosis prophylaxis. I would hold lisinopril for now. I would go ahead and check another urinalysis and urine culture as the patient endorses some dysuria; however, her last urine culture grew VRE, but that was less than 50,000 CFUs. I would place her in isolation at this point, given the VRE. The patient does have mild chronic hyponatremia and was started on some gentle normal saline for now.    TOTAL TIME SPENT: Is 60 minutes.   CODE STATUS: The patient is DO NOT RESUSCITATE.    ____________________________ Krystal Eaton, MD sa:sg D: 02/08/2013 10:14:00 ET T: 02/08/2013 10:52:55 ET JOB#: [id number]  cc: Krystal Eaton, MD, <Dictator> Krystal Eaton MD ELECTRONICALLY SIGNED 02/17/2013 11:33

## 2014-05-29 NOTE — Op Note (Signed)
PATIENT NAME:  Chloe GableBARBER, Vidhi C MR#:  [id number] DATE OF BIRTH:  01-Nov-1922  DATE OF PROCEDURE:  02/08/2013  PREOPERATIVE DIAGNOSIS: Left valgus impacted femoral neck fracture.   POSTOPERATIVE DIAGNOSIS: Left valgus impacted femoral neck fracture.    PROCEDURE PERFORMED: Cannulated screw fixation, left femoral neck.   SURGEON OF RECORD: Danelle Earthlyobin T. Eckel, M.D.   ANESTHESIA: Spinal.   FLUIDS ADMINISTERED: 900 of crystalloid.   ESTIMATED BLOOD LOSS: 10 mL.   URINE OUTPUT: 500 mL.   PREOPERATIVE ANTIBIOTICS: Ancef 1 g.   COMPLICATIONS: None.   DISPOSITION: Stable to PACU upon transport.   INDICATION FOR PROCEDURE: This is an 81 year old female who sustained a fall. An x-ray and CT scan confirmed a valgus impacted femoral neck fracture. The patient and her family were counseled on the risks and benefits of the procedure. The risks to include, but not limited to, bleeding, infection, damage to nerves and/or vessels, malunion, nonunion and the need for additional surgical procedures. Her family agreed to proceed with surgery after weighing the risks.   DESCRIPTION OF PROCEDURE: The patient was brought to the operating room and placed on the fracture table with all extremities appropriately padded. The left leg was then prepped and draped in sterile fashion. A timeout was performed between anesthesia, nursing staff and surgeon of record to confirm surgical site, patient, medical record number, date of birth, laterality, procedure to be performed, confirmation that antibiotics were administered and all necessary equipment was in the room. We next under fluoroscopic guidance placed 4 guide pins for the 6.5 cannulated screws into the proximal femur, confirming our position on both AP and lateral images. We sequentially measured, drilled and placed the four 6.5 partially threaded cannulated screws. We then did fluoroscopy around the world to confirm all screws were contained within the femoral head and  none were protruding into the joint. At this point, we irrigated the stab incisions and closed them with staples. A sterile dressing was applied, and the patient was transferred to the recovery room in stable condition.   ____________________________ Danelle Earthlyobin T. Eckel, MD tte:gb D: 02/08/2013 20:44:50 ET T: 02/09/2013 05:20:49 ET JOB#: [id number]  cc: Danelle Earthlyobin T. Eckel, MD, <Dictator> Danelle EarthlyBIN T ECKEL MD ELECTRONICALLY SIGNED 02/09/2013 19:58

## 2014-05-29 NOTE — Consult Note (Signed)
PATIENT NAME:  [name], Nyana C MR#:  [id number] DATE OF BIRTH:  1923/01/15  DATE OF CONSULTATION:  02/08/2013  CONSULTING PHYSICIAN:  Danelle Earthlyobin T. Eckel, MD  HISTORY OF PRESENT ILLNESS: Ms. [name] is an 81 year old female who sustained a unwitnessed fall complaining of left hip pain and inability to bear weight. She was brought to the Emergency Room for further evaluation.   PAST MEDICAL HISTORY: Significant for hypertension, hyperlipidemia, diverticulitis, GERD.   FAMILY HISTORY: Noncontributory.   PAST SURGICAL HISTORY: Unknown.   REVIEW OF SYSTEMS: Positive for severe hearing loss. recent dizzy spells but no chest pain or shortness of breath. No nausea, vomiting.   PHYSICAL EXAMINATION: The patient appears to be relatively alert 81 year old female in clear discomfort secondary to left hip pain. She is extremely hard of hearing, which makes difficult to communicate with her. She is holding her left lower extremity in a flexed posture. She has palpable dorsalis pedis and posterior tibial pulses to that left lower extremity. X-rays radiographs taken of the pelvis demonstrate a femoral neck fracture on the left side. CT scan was obtained which confirmed this was a valgus-impacted fracture that would be amenable to cannulated screw fixation  ASSESSMENT: This 81 year old female with a valgus impacted femoral neck fracture.   PLAN: The patient will be admitted to the hospital service for medical clearance optimization and  planned for left hip cannulated screw fixation.   ____________________________ Danelle Earthlyobin T. Eckel, MD tte:sg D: 02/08/2013 08:38:12 ET T: 02/08/2013 09:25:48 ET JOB#: [id number]  cc: Danelle Earthlyobin T. Eckel, MD, <Dictator> Danelle EarthlyBIN T ECKEL MD ELECTRONICALLY SIGNED 02/08/2013 21:14

## 2014-05-29 NOTE — Discharge Summary (Signed)
PATIENT NAME:  [name], [name] MR#:  [id number] DATE OF BIRTH:  02/14/1922  DATE OF ADMISSION:  02/08/2013 DATE OF DISCHARGE:  02/11/2013   CONSULTANTS: Dr. Rodena Medin from orthopedics, physical therapy and Dr. Lady Gary from cardiology.   CHIEF COMPLAINT: Status post fall.   DISCHARGE DIAGNOSES:  1. Fall resulting in hip fracture, status post screw fixation on January 4th by Dr. Rodena Medin.  2. Mild hyponatremia.  3. Dementia.  4. Hypertension.  5. Hyperlipidemia.  6. Mild hyponatremia.  7. Hypomagnesemia.  8. History of coronary artery disease.  9. History of recent stroke with right-sided weakness.  10. Osteoporosis.  11. Anxiety.  12. Depression.  13. History of carpal tunnel syndrome.  14. History of diverticulosis.  15. History of chronic pain.  16. Recurrent urinary tract infections. 17. History of pancreatic mass.  18. Reactive airway disease.  19. History of falls and pelvic fracture recently in the past.  20. Acute blood loss anemia.   DISCHARGE MEDICATIONS:  1. Sertraline 50 mg once a day.  2. Fluticasone nasal 2 sprays once a day at bedtime.  3. Loratadine 10 mg once a day.  4. Omeprazole 20 mg daily.  5. Vitamin D3 1000 units once a day. 6. RisaQuad 1 cap once a day.  7. Atorvastatin 10 mg daily.  8. Aspirin 81 mg daily.  9. Calcium with D 1 tab 3 times a day.  10. Zolpidem 5 mg at bedtime.  11. Triamcinolone apply topically to arm 2 times a day.  12. Bacitracin apply to wound on forearm as needed with dressing changes. 13. ProAir 2 puffs 4 times a day as needed for wheezing.  14. Metoprolol 50 mg 1 tab 2 times a day. 15. Tylenol 650 mg every 4 hours as needed for pain or temperature greater than 100.4. 16. Lovenox per orthopedics. 17. Docusate/senna 1 tab 2 times a day.  18. Lisinopril 20 mg once a day.   DIET: Low sodium.   ACTIVITY: As tolerated.   FOLLOWUP: Please follow with orthopedics and PCP within 1 to 2 weeks.   DISPOSITION: To rehab.   SIGNIFICANT  LABORATORIES AND IMAGING: Initial BUN 15, creatinine 0.66, sodium 131, potassium 3.8. Initial white count of 4.1, hemoglobin 12.9, platelets 289. Urine cultures showing 5000 CFU of gram-positive rods, no further ID. UA did not suggest infection. Last hemoglobin was 9.5 on January 6th. CT of the left hip showing mildly impacted and displaced subcapital fracture of the left femoral neck. No dislocation or left pelvic fracture demonstrated. Chest x-ray, 1-view: No acute findings.  HISTORY OF PRESENT ILLNESS AND HOSPITAL COURSE: For full details of H and P, please see the dictation on January 4 by Dr. Jacques Navy, but briefly, this is an 81 year old female with dementia, depression, recent hospitalization for encephalopathy secondary to UTI and hyponatremia, who came in status post fall, resulting in hip fracture, as dictated above. She was a poor historian and very hard of hearing. She wanted to go to the bathroom at 6:00 a.m. on day of admission and reached for her walker, but could not grip it and fell, resulting in immediate pain in the left side of the hip. She was admitted to the hospitalist service. She did have an abnormal EKG, without any chest pain. Cardiology was consulted, and ultimately the patient underwent surgery on the 4th of January and tolerated the procedure well. The patient has been getting followed up by orthopedics.   In regards to hypertension, her blood pressure is relatively well controlled except  for episodes of pain. Generally, it is more on the higher side, and her lisinopril was increased to 20 mg at discharge. Her beta blocker was continued as well, and she tolerated the surgery well.   The patient did have a drop in hemoglobin, likely blood loss anemia in the setting of hip surgery. She did not need a blood transfusion.   The patient had mild hyponatremia, which is stable, as well as mild hypomagnesemia.   She did have constipation, and we have her on a bowel regimen, and she has  received enema and suppository.   CODE STATUS: She is do not resuscitate.   TOTAL TIME SPENT ON DICTATION: 33 minutes.   ____________________________ Krystal EatonShayiq Khaila Velarde, MD sa:lb D: 02/11/2013 13:40:03 ET T: 02/11/2013 14:18:09 ET JOB#: [id number]  cc: Krystal EatonShayiq Caitlynn Ju, MD, <Dictator> Krystal EatonSHAYIQ Zaiden Ludlum MD ELECTRONICALLY SIGNED 02/17/2013 11:33

## 2014-05-29 NOTE — Consult Note (Signed)
Brief Consult Note: Diagnosis: left hip fracture.   Patient was seen by consultant.   Recommend further assessment or treatment.   Discussed with Attending MD.   Comments: 81 yo female with history of recent right body cva who was admitted after suffering a fall causing left hip fracture. Was noted to have tachcyardia with nonspecific st t wave changes on ekg. Had workup in 8/14 with echo revealing preserved lv function. Is a difficult historian but denies chest pain. IS mid to high risk for surgery from cardiac standpoint due to age and comorbidity but is optimized. No signficant changes on her ekg. Would continue with beta blockers through the perioperative period.  EKG post op. WIll follow post op. WOuld proceed with surgery without further cardiac workup.  Electronic Signatures: Dalia HeadingFath, Kobee Medlen A (MD)  (Signed 04-Jan-15 12:33)  Authored: Brief Consult Note   Last Updated: 04-Jan-15 12:33 by Dalia HeadingFath, Raniyah Curenton A (MD)

## 2014-05-30 NOTE — H&P (Signed)
PATIENT NAME:  [name], DUFFELL MR#:  [id number] DATE OF BIRTH:  1922-07-24  DATE OF ADMISSION:  02/10/2011  PRIMARY CARE PHYSICIAN: Dr. Randa Lynn    REFERRING PHYSICIAN: ER physician, Dr. Lorenso Courier    CHIEF COMPLAINT: The patient has multiple complaints- abdominal pain, back pain, chest pain. Her predominant complaint is left-sided chest pain with radiation to her upper back.   HISTORY OF PRESENT ILLNESS: The patient is an 81 year old female with a history of hyperlipidemia, hypertension, osteoporosis, anxiety, depression, diverticulosis, and chronic pain syndrome who was sent from [facility] for multiple medical complaints. The patient reports that she was unable to get up this morning. She also complained of abdominal pain, back pain, and chest pain. Her predominant complaint at this time is left-sided chest pain with radiation into her upper back. She was found to have signs of sepsis with fever of 100.7, tachycardia, and leukocytosis. A CT angio showed left apical lung mass, malignancy versus infiltrate with reactive lymphadenopathy.  In view of the patient's fever, tachycardia, and white count, this was likely a pneumonia and not malignancy.   ALLERGIES: Citalopram, sulfa, doxycycline, trazodone, Paxil, Ativan/lorazepam.   PAST MEDICAL HISTORY:  1. Hypertension.  2. Hyperlipidemia.  3. Osteoporosis with history of compression fracture.  4. Mild coronary artery disease.  5. Anxiety.  6. Depression. 7. Carpal tunnel syndrome.  8. History of diverticulosis with diverticulitis.  9. Chronic pain syndrome.  10. Urinary retention.  11. History of pancreatic mass/cyst. 12. Admission to [facility] from 02/01/2010 to 01/24/2010 for altered mental status, which was felt to be progressive dementia.   CURRENT MEDICATIONS:  1. Milk of Magnesia 30 mL daily. 2. Tussin DM 10 mL q. 4 hours p.r.n. cough. 3. Tramadol 1 tablet 50 mg q. 4 hours p.r.n.  4. Mucinex 1 tablet b.i.d.    5. Zoloft 50 mg daily.   6. Omeprazole 20 mg b.i.d.  7. Flonase two sprays each nostril daily.  8. Simvastatin 20 mg daily.  9. Ambien 5 mg at bedtime.  10. Tylenol p.r.n. 11. Caltrate with vitamin D 1 tablet daily.  12. Certa-Vite 18/0.4 mg, 1 tablet daily.  13. Cetirizine 10 mg daily.  14. Dicyclomine 10 mg daily.  15. Meclizine 25 mg daily.  16. Toprol-XL 50 mg daily.   SOCIAL HISTORY: The patient is currently living at the [facility]. There is no history of smoking, alcohol, or drug abuse.   FAMILY HISTORY:  There is a family history of heart disease and diabetes.   REVIEW OF SYSTEMS: CONSTITUTIONAL: Patient denies any recent weight changes. She was febrile in the ER. She reports weakness, fatigue, and inability to get up this morning. EYES: Denies any blurred or double vision. ENT: The patient is very hard of hearing. Denies any tinnitus or dizziness. CARDIOVASCULAR:  Denies any tachycardia or palpitations. RESPIRATORY: Reports painful respiration, wheezing, and cough. GI: Reports abdominal pain. Denies any nausea, vomiting, or diarrhea. GU: Denies any nocturia, pyuria, hematuria. MUSCULOSKELETAL: Has chronic pain syndrome and degenerative joint disease, reports back pain and muscle pain. INTEGUMENT: Denies any acne or eruptions. NEUROLOGICAL: Denies any fainting spells, blackouts, or seizures. PSYCH: Has history of anxiety and depression. ENDOCRINE: Denies any thyroid problems, heat or cold intolerance. HEME/LYMPH: Denies any anemia or easy bruisability.   PHYSICAL EXAMINATION:  VITAL SIGNS: Temperature 100.7, heart rate 122, respiratory rate 20, blood pressure 151/82, pulse oximetry 90%.   GENERAL: The patient is an elderly Caucasian female who is in mild to moderate distress. She  is unable to lay down still.  She is constantly pressing the left side of her chest and reports that she is having severe pain radiating into her upper back.   HEAD: Atraumatic, normocephalic.   EYES: There is no  pallor, icterus, or cyanosis. Pupils are equal, round and reactive to light and accommodation. Extraocular movements intact.   ENT: Wet mucous membranes. No oropharyngeal erythema or thrush.  NECK: Supple. No masses. No JVD. No thyromegaly or lymphadenopathy.   CHEST WALL: There is tenderness to palpation on the left side under the patient's left breast. The patient is not using accessory muscles of respiration. No intercostal retractions.   LUNGS: The patient has bilateral rhonchi especially on the left side. There is some wheezing and crepitations on the left side.   CARDIOVASCULAR: S1, S2 regular. There is a systolic murmur. No rubs or gallops.  ABDOMEN:  Soft.  There is no guarding. No rigidity. There is mild diffuse tenderness. Normal bowel sounds.   SKIN: No rashes or lesions.   PERIPHERIES: No pedal edema, 1+ pedal pulses.   MUSCULOSKELETAL: No cyanosis or clubbing.   NEUROLOGIC: The patient is awake and alert. Moves all four extremities. Nonfocal neurological exam.   PSYCH: Very anxious.  LABORATORY, DIAGNOSTIC, AND RADIOLOGICAL DATA:  Urinalysis shows evidence of infection. CT angio chest, abdomen, and pelvis shows left upper lobe mass, possible malignancy.  Correlate for infiltrate. Atherosclerotic disease without evidence of severe stenosis or obstruction. Right renal cyst. Area of atelectasis in the lung bases. Abdominal three-way: No acute abnormalities. Lactic acid 1.2. BNP 981. Cardiac enzymes negative. Glucose 140, BUN 22, creatinine 1, sodium 134. Normal electrolytes. Normal LFTs. White count 20.3, normal hemoglobin, hematocrit, and platelet count, lipase 64.  ASSESSMENT AND PLAN:  81 year old female with past medical history of hypertension, hyperlipidemia, osteoporosis, anxiety, and depression who presents with multiple nonspecific complaints, predominantly left-sided chest pain with radiation to the upper back.  1. Sepsis with fever, tachycardia, and white count: The  patient has evidence of acute infection. We will obtain cultures and treat with empiric antibiotics.  2. Left lung apical mass on CT angio: The radiologist read it as malignancy versus infection. The patient also has lymphadenopathy. The patient likely has pneumonia in view of her presenting symptoms of sepsis with fever, elevated white count, and tachycardia. We will treat the patient with IV fluids, obtain blood and urine cultures and start on empiric broad-spectrum antibiotics.  3. Urinary tract infection: Urinalysis shows evidence of infection.  We will send urine cultures. The patient empiric antibiotics of vancomycin and Zosyn should cover for any urinary tract infection. 4. Hyperglycemia:  Possibly reactive. The patient has no history of diabetes. 5. Mild dehydration with elevated BUN of 22 and sodium 134. We will treat with IV fluid hydration. Monitor. 6. Leukocytosis: This is likely due to acute infection. We will monitor closely. 7. Hypertension: We will continue the patient's Toprol-XL and adjust medications as needed to achieve good hypertensive control.  8. Hyperlipidemia: We will continue patient's simvastatin. 9. CODE STATUS:  Discussed with the patient and her brother at bedside, Mr. Phylliss BobKeith Clark. The patient has a LIVING WILL and she is a DO NOT RESUSCITATE. We will place on GI and deep vein thrombosis prophylaxis. Reviewed old medical records, discussed with the ER physician, discussed with the patient and her family the plan of care and management.   TIME SPENT: 75 minutes.  ____________________________ Darrick MeigsSangeeta Ahmani Prehn, MD sp:bjt D: 02/10/2011 15:04:04 ET T: 02/10/2011 15:42:49 ET JOB#:  [id number]  cc: Reola Mosher. Randa Lynn, MD Darrick Meigs MD ELECTRONICALLY SIGNED 02/11/2011 13:19

## 2014-05-30 NOTE — Discharge Summary (Signed)
PATIENT NAME:  [name], [name] MR#:  [id number] DATE OF BIRTH:  [birth year]  DATE OF ADMISSION:  02/10/2011 DATE OF DISCHARGE:  02/15/2011  For a detailed note, please take a look at the history and physical done on admission by Dr. Dava Gonzalez.   DIAGNOSES AT DISCHARGE:  1. Sepsis/systemic inflammatory response syndrome due to urinary tract infection and pneumonia.  2. Pneumonia.  3. Urinary tract infection secondary to Klebsiella.  4. Hypertension.  5. Gastroesophageal reflux disease.  6. Hyperlipidemia.  7. Tachycardia.  8. Anxiety.   DIET: The patient is being discharged on a low-sodium, mechanical soft, low-fat diet.   She is being referred for home health physical therapy and occupational therapy.   FOLLOWUP: Followup is with Dr [name] in the next 1 to 2 weeks.   DISCHARGE MEDICATIONS:    1. Omeprazole 20 mg b.i.d.  2. Bentyl 10 mg b.i.d.  3. Multivitamin daily.  4. Ambien 5 mg at bedtime.  5. Simvastatin 40 mg at bedtime.   6. Meclizine 25 mg daily as needed.  7. Calcium with vitamin D 1 tab daily.  8. Tramadol 50 mg 4 times daily as needed.  9. Certa-Vite with antioxidants 1 tab daily.  10. Cetirizine 10 mg daily.  11. Zoloft 50 mg daily.  12. Flonase 2 sprays each nostril at bedtime.  13. Tylenol 650 q.6 hours p.r.n.  14. Milk of magnesium 30 mL daily as needed. 15. Robitussin cough syrup 10 mL 4 hours p.r.n.  16. Mucinex 600 mg b.i.d.  17. Levaquin 750 mg p.o. 48 hours x7 doses.  18. Toprol 50 mg p.o. b.i.d.   PERTINENT STUDIES DONE DURING HOSPITAL COURSE: A CT scan of the chest, abdomen done with and without contrast on admission showing possible left upper lobe infiltrate, atherosclerotic disease without evidence of severe stenosis, right renal cyst, atelectasis of the lung bases. Also abdomen 3-way done showing bony degenerative changes, atherosclerotic disease. A repeat chest x-ray done on February 13, 2011 showing a diffuse left lower lobe infiltrate compatible with  pneumonia. A urine culture positive for Klebsiella.   HOSPITAL COURSE: This is an 81 year old female with medical problems as mentioned above who presented to the hospital with fever, tachycardia, and shortness of breath, and sepsis/systemic inflammatory response syndrome.  1. Sepsis/systemic inflammatory response syndrome. The patient presented to the hospital with fever, tachycardia, elevated white cell count, and an abnormal urinalysis and shortness of breath. This was thought to be secondary to a combination of underlying urinary tract infection and pneumonia. Her urinalysis was positive, and she grew out Klebsiella from her urine. She was empirically first started on vancomycin and Zosyn. Once her urine came back to be positive for Klebsiella, her antibiotics were narrowed down to Levaquin. Her white cell count has improved. She has had no fevers. Her blood cultures have remained negative. She was unable to give us a sputum sample. Presently she is hemodynamically stable, afebrile, and therefore discharged on p.o. Levaquin for treatment for her SIRS and sepsis secondary to urinary tract infection/ pneumonia.  2. Pneumonia. When patient presented to the hospital she had a CT chest which showed a possible left upper lobe malignancy, but given the fact that she had some tachycardia and fever this was likely pneumonia. She had a repeat chest x-ray on 02/13/2011 which showed a dense left lower lobe infiltrate. Initially she was treated with vancomycin and Zosyn and it was then tapered down to Levaquin. She still is a little bit hypoxic. Therefore, she was  discharged on some minimal oxygen at 2L as she desaturated to 84% just on minimal exertion.  3. Urinary tract infection. This was likely secondary to the Klebsiella, which is what grew out of urine. It was sensitive to Levaquin. She was therefore discharged on the Levaquin.  4. Tachycardia. When patient presented she was severely tachycardic. She continued to  have a persistent tachycardia. There was some concern for pulmonary embolism but she had a CT chest when she first came in which did not show any evidence of pulmonary emboli. This was likely thought to be related to her respiratory illness. Once her respiratory symptoms improved her tachycardia improved. She was slightly hypertensive.  I doubled her Toprol dose which seemed to have controlled the heart rate a bit better.  5. Hypertension. As mentioned she did have some labile blood pressures. She was on just Toprol which was doubled and her blood pressure further needs to be followed on the increasing dose of Toprol.  6. Hyperlipidemia. The patient was maintained on simvastatin. She will resume that.  7. Depression. The patient was maintained on her Zoloft. She will resume that.  8. Gastroesophageal reflux disease. The patient was maintained on her Prilosec and she will resume that too.   The patient was discharged to her assisted living at [facility] with referral for PT and OT.  I did mention to the patient and the family about possible short-term rehabilitation or skilled nursing placement but they were not interested in that at this time.  They wanted to take her back to [facility] with PT and OT which is what was arranged for her.   CODE STATUS: The patient is a do not resuscitate.   TIME SPENT: 40 minutes.      ____________________________ [name]. Chloe Kaiser, MD vjs:vtd D: 02/15/2011 17:17:51 ET T: 02/17/2011 12:27:24 ET JOB#: [id number]  cc: [name]. Chloe Kaiser, MD, <Dictator> [name]. Chloe Lynn, MD Chloe Siren MD ELECTRONICALLY SIGNED 02/19/2011 16:42

## 2015-07-14 IMAGING — CT CT ABD-PELV W/ CM
2 of 5 series · 16 of 46 positions shown, 18 images · IV contrast (isovue)
Comparison: CT scan of June 06, 2012.

CLINICAL DATA: Abdominal pain.

EXAM:
CT ABDOMEN AND PELVIS WITH CONTRAST
TECHNIQUE: Multidetector CT imaging of the abdomen and pelvis was performed
using the standard protocol following bolus administration of
intravenous contrast.
CONTRAST:  75 mls of Isovue 300 intravenously.

[Series 2: routine abd pel with · axial · 0.65mm/px · z∈[-408,-28]mm · 13 of 86 slices shown, 15 images]
[im 5/86  soft-tissue]
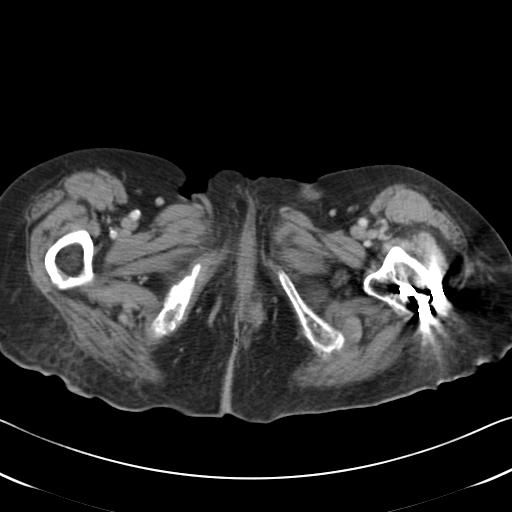
[im 5/86  bone]
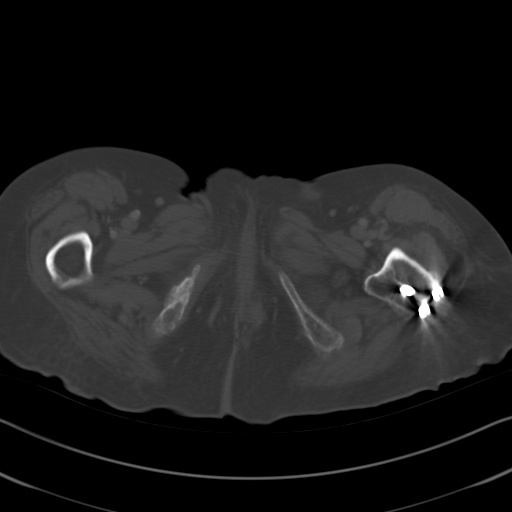
[im 13/86  soft-tissue]
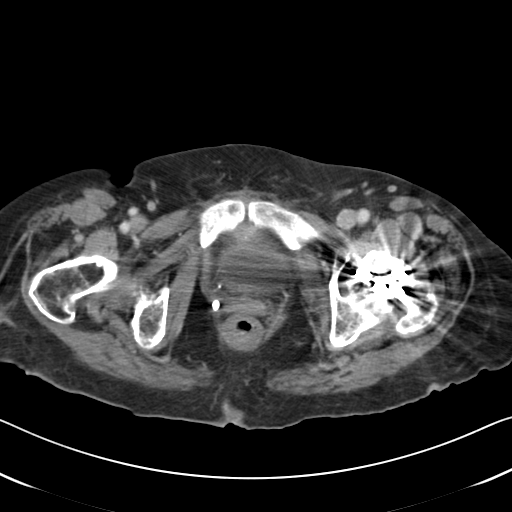
[im 18/86  soft-tissue]
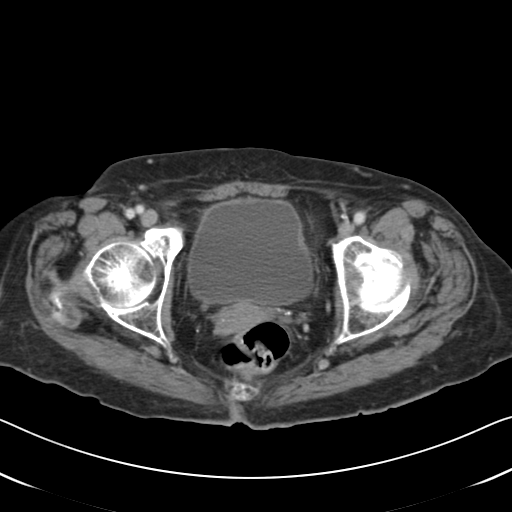
[im 26/86  soft-tissue]
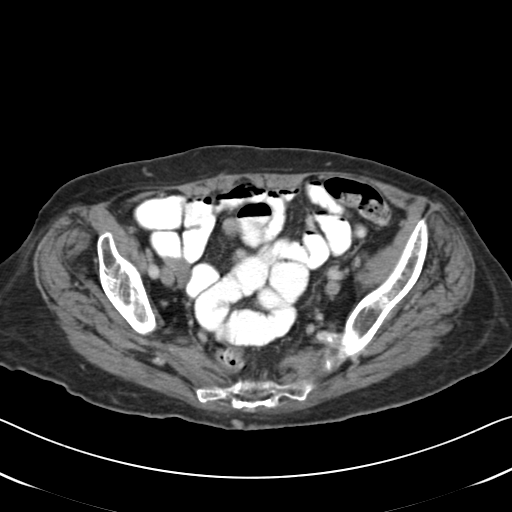
[im 30/86  soft-tissue]
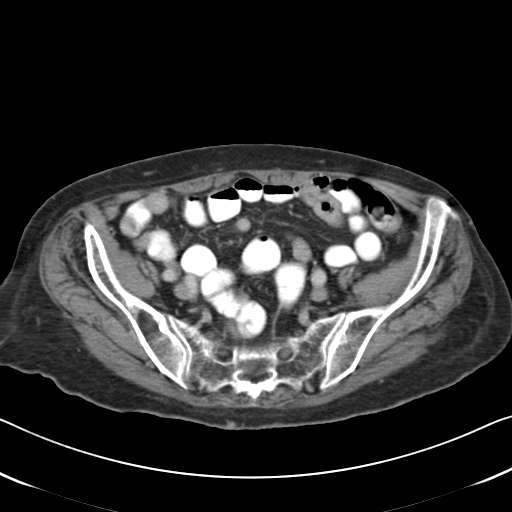
[im 39/86  soft-tissue]
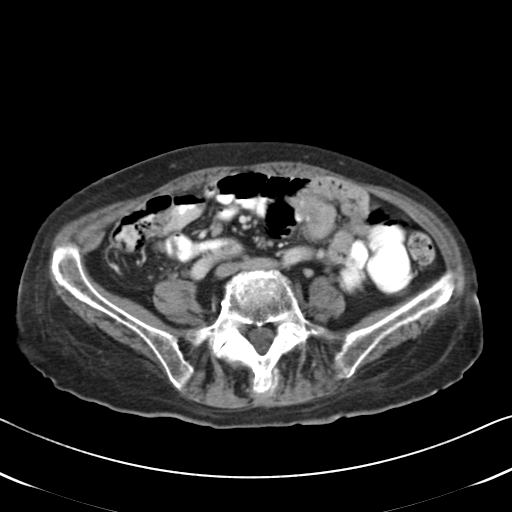
[im 43/86  soft-tissue]
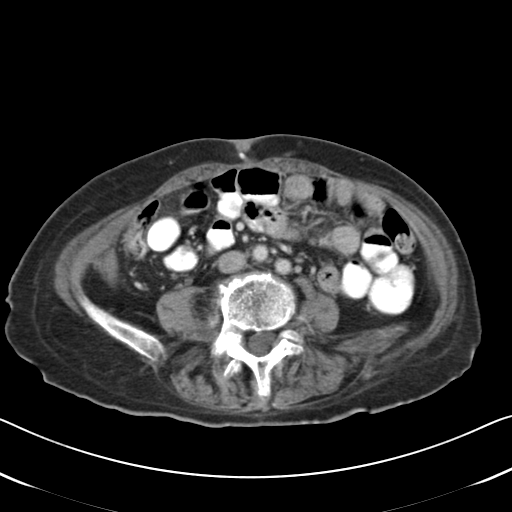
[im 47/86  soft-tissue]
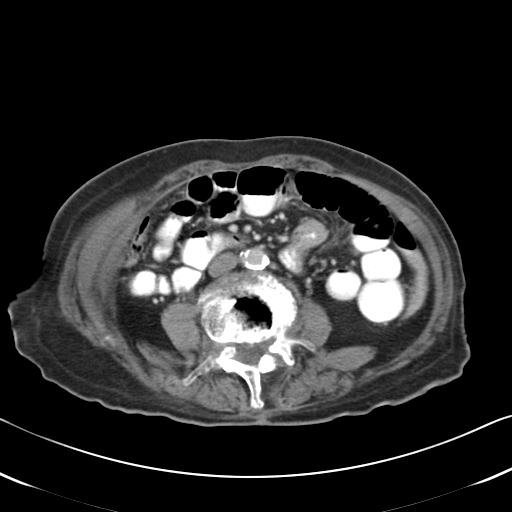
[im 56/86  soft-tissue]
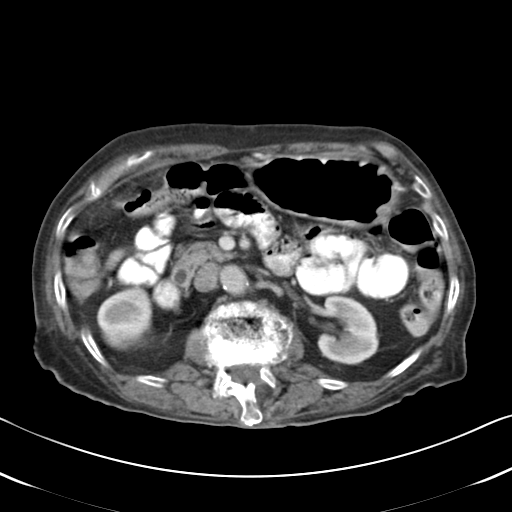
[im 56/86  bone]
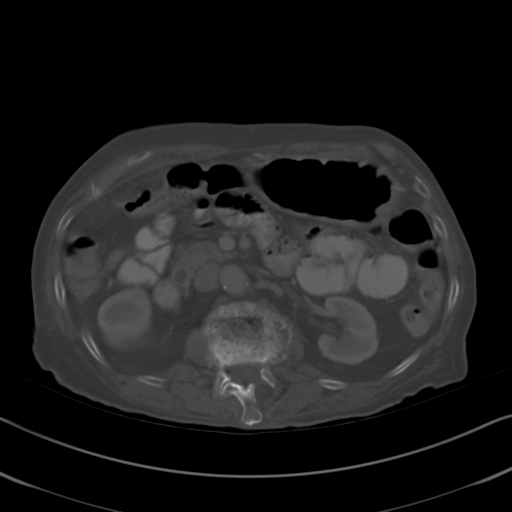
[im 60/86  soft-tissue]
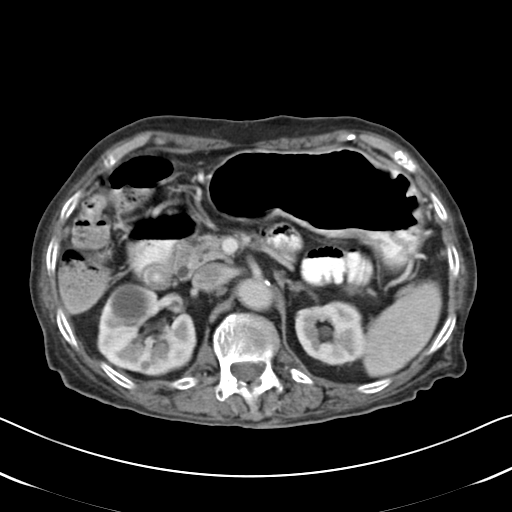
[im 69/86  soft-tissue]
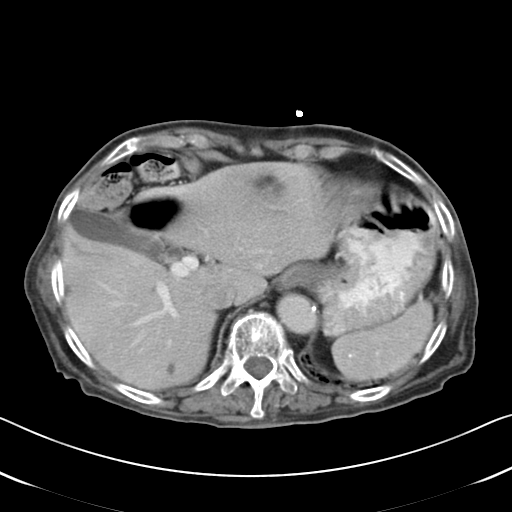
[im 73/86  soft-tissue]
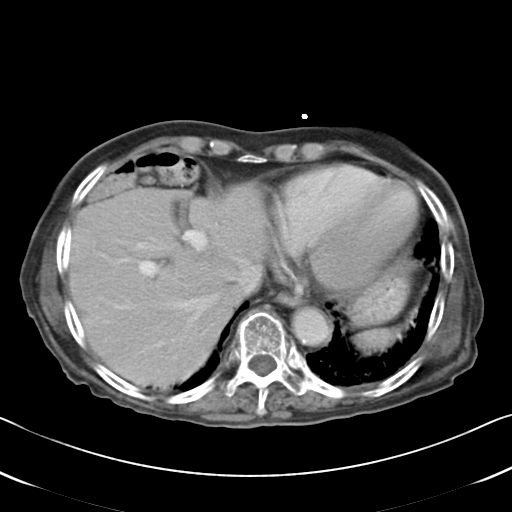
[im 81/86  soft-tissue]
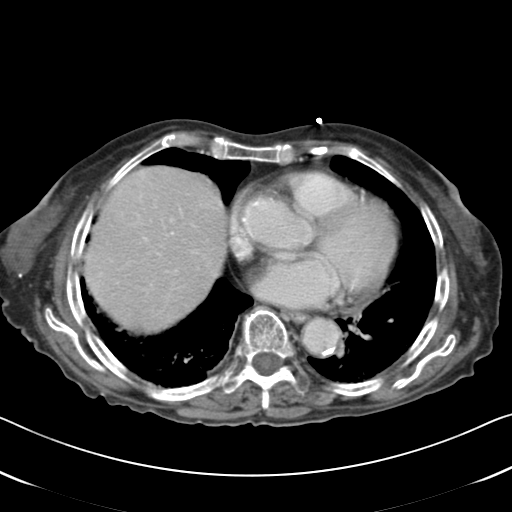

[Series 6: cor routine abd pel with · coronal · 0.62mm/px · 3 of 95 slices shown]
[im 32/95  soft-tissue]
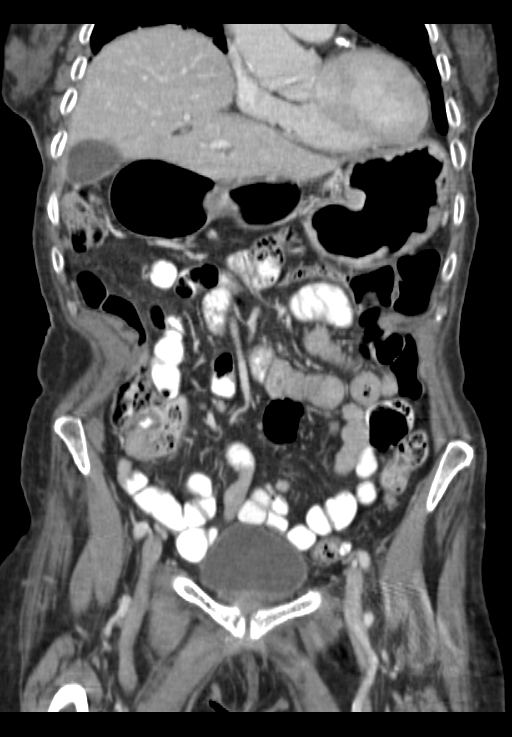
[im 42/95  soft-tissue]
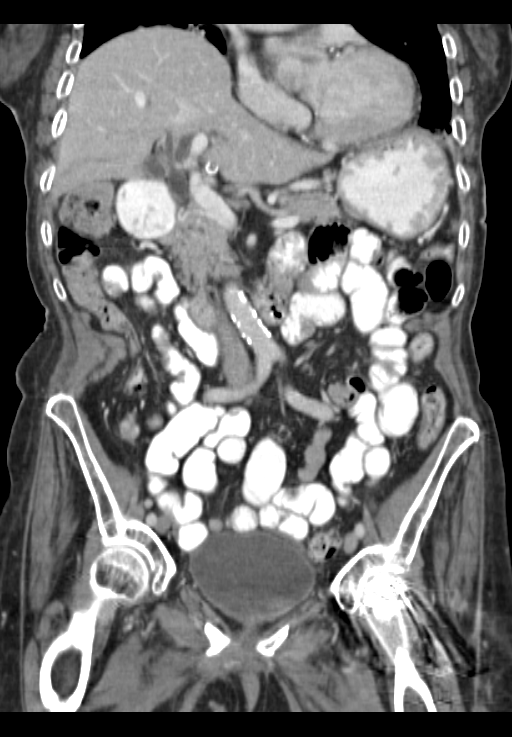
[im 53/95  soft-tissue]
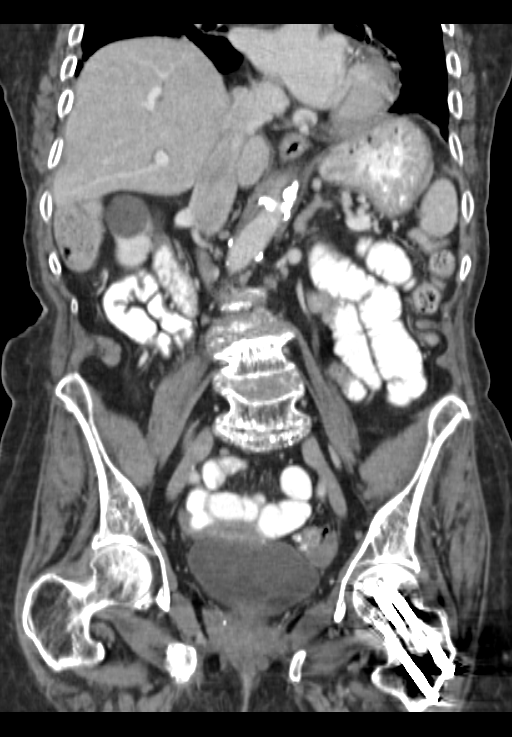

[16 of 46 positions shown; findings below may reference images not displayed]

FINDINGS: Severe multilevel degenerative disc disease is noted in the lumbar
spine. No acute abnormality is noted in visualized lung bases.

No gallstones are noted. Small cyst is seen in posterior segment of
right hepatic lobe. Otherwise, the liver, spleen and pancreas appear
normal. Distal common bile duct measures 8 mm which is within normal
limits for a patient of this age. Adrenal glands appear normal.
cm cyst is seen arising from the midpole of the right kidney. No
hydronephrosis or renal obstruction is noted. No renal or ureteral
calculi are noted. Atherosclerotic calcifications of abdominal aorta
are noted without aneurysm formation. Sigmoid diverticulosis is
noted without inflammation. Urinary bladder appears normal. There is
no evidence of bowel obstruction. No abnormal fluid collection is
noted. Status post surgical fixation of left hip and proximal femur.
IMPRESSION: Severe multilevel degenerative disc disease is noted in the lumbar
spine.

Simple right renal cyst.

Sigmoid diverticulosis without inflammation.

No acute abnormality seen in the abdomen or pelvis.
